# Patient Record
Sex: Female | Born: 1970 | Race: White | Hispanic: No | Marital: Married | Smoking: Never smoker
Health system: Southern US, Community
[De-identification: ages and names within clinical notes are randomized; demographics above are authoritative.]

## PROBLEM LIST (undated history)

## (undated) DIAGNOSIS — E559 Vitamin D deficiency, unspecified: Secondary | ICD-10-CM

## (undated) DIAGNOSIS — M329 Systemic lupus erythematosus, unspecified: Secondary | ICD-10-CM

## (undated) DIAGNOSIS — M1990 Unspecified osteoarthritis, unspecified site: Secondary | ICD-10-CM

## (undated) DIAGNOSIS — M797 Fibromyalgia: Secondary | ICD-10-CM

## (undated) DIAGNOSIS — IMO0002 Reserved for concepts with insufficient information to code with codable children: Secondary | ICD-10-CM

## (undated) DIAGNOSIS — M0820 Juvenile rheumatoid arthritis with systemic onset, unspecified site: Secondary | ICD-10-CM

## (undated) DIAGNOSIS — I10 Essential (primary) hypertension: Secondary | ICD-10-CM

## (undated) DIAGNOSIS — I471 Supraventricular tachycardia, unspecified: Secondary | ICD-10-CM

## (undated) DIAGNOSIS — F5101 Primary insomnia: Secondary | ICD-10-CM

## (undated) DIAGNOSIS — M546 Pain in thoracic spine: Secondary | ICD-10-CM

## (undated) DIAGNOSIS — I011 Acute rheumatic endocarditis: Secondary | ICD-10-CM

## (undated) DIAGNOSIS — E538 Deficiency of other specified B group vitamins: Secondary | ICD-10-CM

## (undated) DIAGNOSIS — F419 Anxiety disorder, unspecified: Secondary | ICD-10-CM

## (undated) DIAGNOSIS — M5134 Other intervertebral disc degeneration, thoracic region: Secondary | ICD-10-CM

## (undated) DIAGNOSIS — M069 Rheumatoid arthritis, unspecified: Secondary | ICD-10-CM

## (undated) HISTORY — PX: ACHILLES TENDON REPAIR: SUR1153

## (undated) HISTORY — PX: BACK SURGERY: SHX140

## (undated) HISTORY — PX: ABLATION: SHX5711

## (undated) HISTORY — PX: OTHER SURGICAL HISTORY: SHX169

## (undated) HISTORY — PX: TUBAL LIGATION: SHX77

## (undated) HISTORY — PX: OSTECTOMY CALCANEUS: SUR969

## (undated) HISTORY — PX: NECK SURGERY: SHX720

## (undated) HISTORY — PX: HYSTEROSCOPY W/ ENDOMETRIAL ABLATION: SUR665

## (undated) HISTORY — PX: COLONOSCOPY: SHX174

---

## 2008-01-25 ENCOUNTER — Emergency Department (HOSPITAL_COMMUNITY): Admission: EM | Admit: 2008-01-25 | Discharge: 2008-01-25 | Payer: Self-pay | Admitting: Emergency Medicine

## 2008-08-23 ENCOUNTER — Emergency Department (HOSPITAL_COMMUNITY): Admission: EM | Admit: 2008-08-23 | Discharge: 2008-08-23 | Payer: Self-pay | Admitting: Emergency Medicine

## 2013-09-02 ENCOUNTER — Observation Stay: Payer: Self-pay | Admitting: Obstetrics and Gynecology

## 2013-09-02 LAB — BASIC METABOLIC PANEL
Anion Gap: 3 — ABNORMAL LOW (ref 7–16)
Calcium, Total: 9.2 mg/dL (ref 8.5–10.1)
Creatinine: 0.68 mg/dL (ref 0.60–1.30)
Osmolality: 276 (ref 275–301)
Potassium: 3.7 mmol/L (ref 3.5–5.1)

## 2013-09-02 LAB — CBC
HCT: 28.4 % — ABNORMAL LOW (ref 35.0–47.0)
HGB: 9.7 g/dL — ABNORMAL LOW (ref 12.0–16.0)
MCH: 27.2 pg (ref 26.0–34.0)
RDW: 14.3 % (ref 11.5–14.5)

## 2013-09-02 LAB — URINALYSIS, COMPLETE
Glucose,UR: NEGATIVE mg/dL (ref 0–75)
Hyaline Cast: 2
Protein: NEGATIVE
Specific Gravity: 1.018 (ref 1.003–1.030)

## 2013-09-02 LAB — GC/CHLAMYDIA PROBE AMP

## 2013-09-02 LAB — PREGNANCY, URINE: Pregnancy Test, Urine: NEGATIVE m[IU]/mL

## 2013-09-03 LAB — CBC WITH DIFFERENTIAL/PLATELET
Basophil %: 0.5 %
Eosinophil #: 0.1 10*3/uL (ref 0.0–0.7)
HCT: 31.7 % — ABNORMAL LOW (ref 35.0–47.0)
Lymphocyte %: 21.5 %
MCH: 28.2 pg (ref 26.0–34.0)
MCHC: 34.9 g/dL (ref 32.0–36.0)
MCV: 81 fL (ref 80–100)
Monocyte #: 0.7 x10 3/mm (ref 0.2–0.9)
Monocyte %: 6.8 %
Neutrophil #: 6.8 10*3/uL — ABNORMAL HIGH (ref 1.4–6.5)
Neutrophil %: 69.8 %
Platelet: 334 10*3/uL (ref 150–440)
RBC: 3.91 10*6/uL (ref 3.80–5.20)
WBC: 9.7 10*3/uL (ref 3.6–11.0)

## 2014-07-13 ENCOUNTER — Emergency Department: Payer: Self-pay | Admitting: Emergency Medicine

## 2014-07-25 ENCOUNTER — Emergency Department (HOSPITAL_COMMUNITY)
Admission: EM | Admit: 2014-07-25 | Discharge: 2014-07-25 | Disposition: A | Discharge status: Home / Self Care | Payer: No Typology Code available for payment source | Attending: Emergency Medicine | Admitting: Emergency Medicine

## 2014-07-25 ENCOUNTER — Emergency Department (HOSPITAL_COMMUNITY): Payer: No Typology Code available for payment source

## 2014-07-25 ENCOUNTER — Encounter (HOSPITAL_COMMUNITY): Payer: Self-pay | Admitting: Emergency Medicine

## 2014-07-25 DIAGNOSIS — S52599A Other fractures of lower end of unspecified radius, initial encounter for closed fracture: Secondary | ICD-10-CM | POA: Diagnosis not present

## 2014-07-25 DIAGNOSIS — Y9351 Activity, roller skating (inline) and skateboarding: Secondary | ICD-10-CM | POA: Insufficient documentation

## 2014-07-25 DIAGNOSIS — S4980XA Other specified injuries of shoulder and upper arm, unspecified arm, initial encounter: Secondary | ICD-10-CM | POA: Insufficient documentation

## 2014-07-25 DIAGNOSIS — S46909A Unspecified injury of unspecified muscle, fascia and tendon at shoulder and upper arm level, unspecified arm, initial encounter: Secondary | ICD-10-CM | POA: Diagnosis present

## 2014-07-25 DIAGNOSIS — Z8739 Personal history of other diseases of the musculoskeletal system and connective tissue: Secondary | ICD-10-CM | POA: Insufficient documentation

## 2014-07-25 DIAGNOSIS — Y9289 Other specified places as the place of occurrence of the external cause: Secondary | ICD-10-CM | POA: Insufficient documentation

## 2014-07-25 DIAGNOSIS — S5291XA Unspecified fracture of right forearm, initial encounter for closed fracture: Secondary | ICD-10-CM

## 2014-07-25 HISTORY — DX: Systemic lupus erythematosus, unspecified: M32.9

## 2014-07-25 HISTORY — DX: Reserved for concepts with insufficient information to code with codable children: IMO0002

## 2014-07-25 HISTORY — DX: Acute rheumatic endocarditis: I01.1

## 2014-07-25 HISTORY — DX: Fibromyalgia: M79.7

## 2014-07-25 MED ORDER — IBUPROFEN 800 MG PO TABS: 800.0000 mg | ORAL_TABLET | Freq: Three times a day (TID) | ORAL | Status: DC

## 2014-07-25 MED ORDER — OXYCODONE-ACETAMINOPHEN 5-325 MG PO TABS: 1.0000 | ORAL_TABLET | ORAL | Status: DC | PRN

## 2014-07-25 MED ORDER — OXYCODONE-ACETAMINOPHEN 5-325 MG PO TABS
1.0000 | ORAL_TABLET | Freq: Once | ORAL | Status: AC
  Administered 2014-07-25: 1 via ORAL
  Filled 2014-07-25: qty 1

## 2014-07-25 NOTE — ED Notes (Signed)
Pt reports taking Ibuprofen roughly 20 minutes ago.

## 2014-07-25 NOTE — Discharge Instructions (Signed)
Radial Fracture °You have a broken bone (fracture) of the forearm. This is the part of your arm between the elbow and your wrist. Your forearm is made up of two bones. These are the radius and ulna. Your fracture is in the radial shaft. This is the bone in your forearm located on the thumb side. A cast or splint is used to protect and keep your injured bone from moving. The cast or splint will be on generally for about 5 to 6 weeks, with individual variations. °HOME CARE INSTRUCTIONS  °· Keep the injured part elevated while sitting or lying down. Keep the injury above the level of your heart (the center of the chest). This will decrease swelling and pain. °· Apply ice to the injury for 15-20 minutes, 03-04 times per day while awake, for 2 days. Put the ice in a plastic bag and place a towel between the bag of ice and your cast or splint. °· Move your fingers to avoid stiffness and minimize swelling. °· If you have a plaster or fiberglass cast: °¨ Do not try to scratch the skin under the cast using sharp or pointed objects. °¨ Check the skin around the cast every day. You may put lotion on any red or sore areas. °¨ Keep your cast dry and clean. °· If you have a plaster splint: °¨ Wear the splint as directed. °¨ You may loosen the elastic around the splint if your fingers become numb, tingle, or turn cold or blue. °¨ Do not put pressure on any part of your cast or splint. It may break. Rest your cast only on a pillow for the first 24 hours until it is fully hardened. °· Your cast or splint can be protected during bathing with a plastic bag. Do not lower the cast or splint into water. °· Only take over-the-counter or prescription medicines for pain, discomfort, or fever as directed by your caregiver. °SEEK IMMEDIATE MEDICAL CARE IF:  °· Your cast gets damaged or breaks. °· You have more severe pain or swelling than you did before getting the cast. °· You have severe pain when stretching your fingers. °· There is a bad  smell, new stains and/or pus-like (purulent) drainage coming from under the cast. °· Your fingers or hand turn pale or blue and become cold or your loose feeling. °Document Released: 04/12/2006 Document Revised: 01/22/2012 Document Reviewed: 07/09/2006 °ExitCare® Patient Information ©2015 [employer]. This information is not intended to replace advice given to you by your health care provider. Make sure you discuss any questions you have with your health care provider. ° °

## 2014-07-25 NOTE — ED Notes (Signed)
Pt states she fell while roller skating landing on right wrist. C/o pain from wrist into forearm. Some bruising noted. Able to move fingers and has positive sensation and pulses.

## 2014-07-27 NOTE — ED Provider Notes (Signed)
CSN: [id number]     Arrival date & time 07/25/14  1941 History   First MD Initiated Contact with Patient 07/25/14 2023     Chief Complaint  Patient presents with  . Arm Injury     (Consider location/radiation/quality/duration/timing/severity/associated sxs/prior Treatment) HPI   STEPHNIE PARLIER is a 43 y.o. female who presents to the Emergency Department complaining of pain and swelling to her distal right forearm and wrist.  She states that she feel while roller skating just prior to ED arrival.  Landed on an outstretched hand.  Reports pain with movement of the wrist.  She has applied ice w/o relief.  She denies other injuries, numbness or weakness of her hand or arm.  Nothing seems to make the pain better.  Patient is right hand dominant.     Past Medical History  Diagnosis Date  . Lupus   . Rheumatoid aortitis   . Fibromyalgia    Past Surgical History  Procedure Laterality Date  . Neck surgery    . Tubal ligation    . Ablation     No family history on file. History  Substance Use Topics  . Smoking status: Never Smoker   . Smokeless tobacco: Not on file  . Alcohol Use: No   OB History   Grav Para Term Preterm Abortions TAB SAB Ect Mult Living                 Review of Systems  Constitutional: Negative for fever and chills.  Genitourinary: Negative for dysuria and difficulty urinating.  Musculoskeletal: Positive for arthralgias and joint swelling. Negative for neck pain.  Skin: Negative for color change and wound.  Neurological: Negative for syncope, weakness and numbness.  All other systems reviewed and are negative.     Allergies  Review of patient's allergies indicates no known allergies.  Home Medications   Prior to Admission medications   Medication Sig Start Date End Date Taking? Authorizing Provider  ibuprofen (ADVIL,MOTRIN) 800 MG tablet Take 1 tablet (800 mg total) by mouth 3 (three) times daily. 07/25/14   Jantz Main L. Daja Shuping, PA-C   oxyCODONE-acetaminophen (PERCOCET/ROXICET) 5-325 MG per tablet Take 1 tablet by mouth every 4 (four) hours as needed. 07/25/14   Oluwatamilore Starnes L. Kristoffer Bala, PA-C   BP 170/101  Pulse 100  Temp(Src) 99.5 F (37.5 C) (Oral)  Resp 20  Ht 5\' 3"  (1.6 m)  Wt 248 lb (112.492 kg)  BMI 43.94 kg/m2  SpO2 98% Physical Exam  Nursing note and vitals reviewed. Constitutional: She is oriented to person, place, and time. She appears well-developed and well-nourished. No distress.  HENT:  Head: Normocephalic and atraumatic.  Cardiovascular: Normal rate, regular rhythm, normal heart sounds and intact distal pulses.   No murmur heard. Pulmonary/Chest: Effort normal and breath sounds normal. No respiratory distress. She exhibits no tenderness.  Musculoskeletal: She exhibits edema and tenderness.  ttp to the distal right wrist. Mild to moderate STS present. Radial pulse is brisk, distal sensation intact.  CR< 2 sec.  No bruising or bony deformity.  Compartments of the arm are soft. No tenderness proximal to the distal forearm.    Neurological: She is alert and oriented to person, place, and time. She exhibits normal muscle tone. Coordination normal.  Skin: Skin is warm and dry.    ED Course  Procedures (including critical care time) Labs Review Labs Reviewed - No data to display  Imaging Review Dg Forearm Right  07/25/2014   CLINICAL DATA:  History of  trauma from a fall complaining of pain in the right forearm.  EXAM: RIGHT FOREARM - 2 VIEW  COMPARISON:  No priors.  FINDINGS: Mildly comminuted intra-articular fracture of the distal radius appears mildly impacted. Overlying soft tissue swelling is noted. The ulna is intact.  IMPRESSION: 1. Mildly comminuted mildly impacted intra-articular fracture of the distal radius.   Electronically Signed   By: Trudie Reed M.D.   On: 07/25/2014 20:16   Dg Wrist Complete Right  07/25/2014   CLINICAL DATA:  Ulnar sided wrist pain.  EXAM: RIGHT WRIST - COMPLETE 3+ VIEW   COMPARISON:  07/13/2014 radiographs.  FINDINGS: There is a subtle transverse impacted fracture of the distal radial metaphysis. Intra-articular extension is most evident on the scaphoid view of the wrist. Overlying soft tissue swelling is present. There is no angulation or displacement of the fracture. The distal ulna is intact. Carpal bones appear within normal limits.  IMPRESSION: Comminuted but nondisplaced transverse distal radius fracture with intra-articular extension into the radiocarpal and distal radioulnar joints.   Electronically Signed   By: Andreas Newport M.D.   On: 07/25/2014 20:18    EKG Interpretation None      MDM   Final diagnoses:  Radius fracture, right, closed, initial encounter    sugar tong splint applied, pain improved, remains NV intact.  Pt agrees to elevate, ice and close orthopedic f/u.  Referral given for Dr. Romeo Apple.  Rx for ibuprofen and percocet for pain.  Pt is feeling better and appears stable for d/c    Frantz Quattrone L. Trisha Mangle, PA-C 07/27/14 2028

## 2014-07-28 ENCOUNTER — Ambulatory Visit (INDEPENDENT_AMBULATORY_CARE_PROVIDER_SITE_OTHER): Payer: No Typology Code available for payment source | Admitting: Orthopedic Surgery

## 2014-07-28 ENCOUNTER — Encounter: Payer: Self-pay | Admitting: Orthopedic Surgery

## 2014-07-28 VITALS — Ht 63.0 in | Wt 248.0 lb

## 2014-07-28 DIAGNOSIS — S52509A Unspecified fracture of the lower end of unspecified radius, initial encounter for closed fracture: Secondary | ICD-10-CM | POA: Insufficient documentation

## 2014-07-28 DIAGNOSIS — S52599A Other fractures of lower end of unspecified radius, initial encounter for closed fracture: Secondary | ICD-10-CM

## 2014-07-28 DIAGNOSIS — S52501A Unspecified fracture of the lower end of right radius, initial encounter for closed fracture: Secondary | ICD-10-CM

## 2014-07-28 MED ORDER — OXYCODONE-ACETAMINOPHEN 10-325 MG PO TABS: 1.0000 | ORAL_TABLET | ORAL | Status: DC | PRN

## 2014-07-28 NOTE — Patient Instructions (Signed)
Return to work, no lifting patients

## 2014-07-28 NOTE — ED Provider Notes (Signed)
Medical screening examination/treatment/procedure(s) were performed by non-physician practitioner and as supervising physician I was immediately available for consultation/collaboration.   EKG Interpretation None        Winfrey Chillemi L Kevionna Heffler, MD 07/28/14 1354 

## 2014-07-28 NOTE — Progress Notes (Signed)
Chief Complaint  Patient presents with  . Follow-up    er follow up right wrist fracture, DOI 07/25/14    HPI: Chief Complaint  Patient presents with  . Follow-up    er follow up right wrist fracture, DOI 07/25/14   43 year old female broke her right wrist on 07/25/2014 secondary to a fall while skating complains of pain swelling which is constant over the right distal radius without deformity. Pain is 8/10 unrelieved by 5 mg Percocet along with ibuprofen. 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  PHYSICAL EXAM  VITAL SIGNS: Ht 5\' 3"  (1.6 m)  Wt 248 lb (112.492 kg)  BMI 43.94 kg/m2 ]  GENERAL normal development grooming and hygiene with obesity  MENTAL STATUS awake alert and oriented x3  MOOD/AFFECT ARE NORMAL   GAIT no pain with ambulation   EXAM OF THE right wrist SKIN slight ecchymosis to the skin INSPECTION mild tenderness at the distal radius ROM painful range of motion and incomplete range of motion secondary to that pain STABILITY normal MOTOR GRADE 5/5     VASC normal radial pulse  NEURO normal sensation  LYMPH no swollen lymph nodes   IMAGING STUDIES transverse fracture distal radius nondisplaced   Dx distal radius fracture   PLAN  short arm cast

## 2014-08-03 ENCOUNTER — Ambulatory Visit (INDEPENDENT_AMBULATORY_CARE_PROVIDER_SITE_OTHER): Payer: Self-pay | Admitting: Orthopedic Surgery

## 2014-08-03 DIAGNOSIS — S52501A Unspecified fracture of the lower end of right radius, initial encounter for closed fracture: Secondary | ICD-10-CM

## 2014-08-03 DIAGNOSIS — S52599A Other fractures of lower end of unspecified radius, initial encounter for closed fracture: Secondary | ICD-10-CM

## 2014-08-04 NOTE — Progress Notes (Signed)
Chief Complaint   Patient presents with   .  Follow-up       er follow up right wrist fracture, DOI 07/25/14    Cast loose;  Hard brace applied   Keep appt.

## 2014-08-11 ENCOUNTER — Telehealth: Payer: Self-pay | Admitting: Orthopedic Surgery

## 2014-08-11 ENCOUNTER — Other Ambulatory Visit: Payer: Self-pay | Admitting: *Deleted

## 2014-08-11 MED ORDER — OXYCODONE-ACETAMINOPHEN 10-325 MG PO TABS: 1.0000 | ORAL_TABLET | ORAL | Status: DC | PRN

## 2014-08-11 NOTE — Telephone Encounter (Signed)
Ms. Angulo is calling asking for a refill on oxyCODONE-acetaminophen (PERCOCET) 10-325 MG per tablet called to [facility] on [address]. Please advise?

## 2014-08-12 NOTE — Telephone Encounter (Signed)
Prescription available, patient aware  

## 2014-08-13 NOTE — Telephone Encounter (Signed)
Patient picked up Rx

## 2014-08-25 ENCOUNTER — Other Ambulatory Visit: Payer: Self-pay | Admitting: *Deleted

## 2014-08-25 ENCOUNTER — Telehealth: Payer: Self-pay | Admitting: Orthopedic Surgery

## 2014-08-25 MED ORDER — OXYCODONE-ACETAMINOPHEN 10-325 MG PO TABS: 1.0000 | ORAL_TABLET | ORAL | Status: DC | PRN

## 2014-08-25 NOTE — Telephone Encounter (Signed)
Patient is requesting a refill on her pain medicationoxyCODONE-acetaminophen (PERCOCET) 10-325 MG per tablet please advise?

## 2014-08-25 NOTE — Telephone Encounter (Signed)
Prescription available, patient aware  

## 2014-08-26 NOTE — Telephone Encounter (Signed)
Patient picked up Rx

## 2014-09-08 ENCOUNTER — Ambulatory Visit (INDEPENDENT_AMBULATORY_CARE_PROVIDER_SITE_OTHER): Payer: No Typology Code available for payment source

## 2014-09-08 ENCOUNTER — Ambulatory Visit (INDEPENDENT_AMBULATORY_CARE_PROVIDER_SITE_OTHER): Payer: Self-pay | Admitting: Orthopedic Surgery

## 2014-09-08 ENCOUNTER — Encounter: Payer: Self-pay | Admitting: Orthopedic Surgery

## 2014-09-08 VITALS — BP 170/101 | Ht 63.0 in | Wt 248.0 lb

## 2014-09-08 DIAGNOSIS — S62101D Fracture of unspecified carpal bone, right wrist, subsequent encounter for fracture with routine healing: Secondary | ICD-10-CM

## 2014-09-08 DIAGNOSIS — S62101A Fracture of unspecified carpal bone, right wrist, initial encounter for closed fracture: Secondary | ICD-10-CM | POA: Insufficient documentation

## 2014-09-08 MED ORDER — OXYCODONE-ACETAMINOPHEN 10-325 MG PO TABS
1.0000 | ORAL_TABLET | ORAL | Status: DC | PRN
Start: 2014-09-08 — End: 2014-09-23

## 2014-09-08 NOTE — Progress Notes (Signed)
Patient ID: DANINE HOR, female   DOB: [birth year], 43 y.o.   MRN: [id number] Chief Complaint  Patient presents with  . Follow-up    6 week recheck + xray Right wrist fx, DOI 07/25/14    BP 170/101  Ht 5\' 3"  (1.6 m)  Wt 248 lb (112.492 kg)  BMI 43.94 kg/m2  Fracture care follow-up right distal radius fracture nondisplaced treated  with DonJoy warm-and-form forearm brace  X-rays look good. Tenderness at the fracture site is noted surrounding the keep her in the brace 3 more weeks and continue Percocet for pain relief   Meds ordered this encounter  Medications  . oxyCODONE-acetaminophen (PERCOCET) 10-325 MG per tablet    Sig: Take 1 tablet by mouth every 4 (four) hours as needed for pain.    Dispense:  84 tablet    Refill:  0

## 2014-09-08 NOTE — Patient Instructions (Signed)
BRACE FOR 3 MORE WEEKS

## 2014-09-23 ENCOUNTER — Telehealth: Payer: Self-pay | Admitting: Orthopedic Surgery

## 2014-09-23 ENCOUNTER — Other Ambulatory Visit: Payer: Self-pay | Admitting: *Deleted

## 2014-09-23 MED ORDER — OXYCODONE-ACETAMINOPHEN 10-325 MG PO TABS: 1.0000 | ORAL_TABLET | ORAL | Status: DC | PRN

## 2014-09-23 NOTE — Telephone Encounter (Signed)
Patient is calling to have pain medicine refilled oxyCODONE-acetaminophen (PERCOCET) 10-325 MG per tablet please advise?

## 2014-09-24 NOTE — Telephone Encounter (Signed)
Prescription available for pick up 

## 2014-10-01 ENCOUNTER — Ambulatory Visit: Payer: No Typology Code available for payment source | Admitting: Orthopedic Surgery

## 2014-10-05 ENCOUNTER — Ambulatory Visit (INDEPENDENT_AMBULATORY_CARE_PROVIDER_SITE_OTHER): Payer: Self-pay | Admitting: Orthopedic Surgery

## 2014-10-05 ENCOUNTER — Ambulatory Visit (INDEPENDENT_AMBULATORY_CARE_PROVIDER_SITE_OTHER): Payer: No Typology Code available for payment source

## 2014-10-05 ENCOUNTER — Encounter: Payer: Self-pay | Admitting: Orthopedic Surgery

## 2014-10-05 VITALS — BP 152/104 | Ht 63.0 in | Wt 248.0 lb

## 2014-10-05 DIAGNOSIS — S62101D Fracture of unspecified carpal bone, right wrist, subsequent encounter for fracture with routine healing: Secondary | ICD-10-CM

## 2014-10-05 NOTE — Addendum Note (Signed)
Addended by: Fuller Canada E on: 10/05/2014 11:05 AM   Modules accepted: Medications

## 2014-10-05 NOTE — Patient Instructions (Signed)
Call to arrange therapy at [facility] outpatient

## 2014-10-05 NOTE — Progress Notes (Signed)
Patient ID: Veronica Nguyen, female   DOB: April 24, 1971, 43 y.o.   MRN: [id number] Chief Complaint  Patient presents with  . Follow-up    3 week recheck on right wrist fracture. DOI 07-25-14.    8 weeks since injury distal radius fracture right wrist still complains of soreness. X-ray shows fracture healing. Exam shows tenderness in the soft tissues and painful wrist flexion  Recommend occupational therapy  Recommend wrist splint  Recommend continue medicine as needed with oxycodone.  Return in 4 weeks after OT

## 2014-10-07 ENCOUNTER — Other Ambulatory Visit: Payer: Self-pay | Admitting: *Deleted

## 2014-10-07 ENCOUNTER — Telehealth: Payer: Self-pay | Admitting: Orthopedic Surgery

## 2014-10-07 MED ORDER — OXYCODONE-ACETAMINOPHEN 10-325 MG PO TABS: 1.0000 | ORAL_TABLET | ORAL | Status: DC | PRN

## 2014-10-07 NOTE — Telephone Encounter (Signed)
Patient is requesting refill on pain medication: oxyCODONE-acetaminophen (PERCOCET) 10-325 MG per tablet [[id number]]   Her next scheduled appointment, 11/02/14.  Patient's ph# is [phone]

## 2014-10-12 NOTE — Telephone Encounter (Signed)
Prescription available, patient aware  

## 2014-10-27 ENCOUNTER — Telehealth: Payer: Self-pay | Admitting: Orthopedic Surgery

## 2014-10-27 ENCOUNTER — Other Ambulatory Visit: Payer: Self-pay | Admitting: *Deleted

## 2014-10-27 ENCOUNTER — Other Ambulatory Visit: Payer: Self-pay | Admitting: Orthopedic Surgery

## 2014-10-27 DIAGNOSIS — S62101D Fracture of unspecified carpal bone, right wrist, subsequent encounter for fracture with routine healing: Secondary | ICD-10-CM

## 2014-10-27 MED ORDER — OXYCODONE-ACETAMINOPHEN 5-325 MG PO TABS: 1.0000 | ORAL_TABLET | ORAL | Status: DC | PRN

## 2014-10-27 MED ORDER — OXYCODONE-ACETAMINOPHEN 10-325 MG PO TABS: 1.0000 | ORAL_TABLET | ORAL | Status: DC | PRN

## 2014-10-27 NOTE — Telephone Encounter (Signed)
Patient needs a refill ooxyCODONE-acetaminophen (PERCOCET) 10-325 MG per tablet, please advise?

## 2014-10-27 NOTE — Telephone Encounter (Signed)
Prescription available, patient aware  

## 2014-10-27 NOTE — Telephone Encounter (Signed)
Patient Picked up Rx 

## 2014-11-02 ENCOUNTER — Encounter: Payer: Self-pay | Admitting: Orthopedic Surgery

## 2014-11-02 ENCOUNTER — Ambulatory Visit: Payer: No Typology Code available for payment source | Admitting: Orthopedic Surgery

## 2014-11-11 ENCOUNTER — Other Ambulatory Visit: Payer: Self-pay | Admitting: *Deleted

## 2014-11-11 ENCOUNTER — Telehealth: Payer: Self-pay | Admitting: Orthopedic Surgery

## 2014-11-11 MED ORDER — OXYCODONE-ACETAMINOPHEN 5-325 MG PO TABS: 1.0000 | ORAL_TABLET | ORAL | Status: DC | PRN

## 2014-11-11 NOTE — Telephone Encounter (Signed)
Patient is asking for a pain medication refill, she did miss her last follow up appointment with Dr. Romeo Apple but has rescheduled to 11/16/14, she is asking for a refill on oxyCODONE-acetaminophen (PERCOCET/ROXICET) 5-325 MG per tablet  Please advise?

## 2014-11-12 NOTE — Telephone Encounter (Signed)
Patient picked up prescription.

## 2014-11-16 ENCOUNTER — Ambulatory Visit: Payer: No Typology Code available for payment source | Admitting: Orthopedic Surgery

## 2014-11-18 ENCOUNTER — Encounter: Payer: Self-pay | Admitting: Orthopedic Surgery

## 2015-03-05 NOTE — Op Note (Signed)
PATIENT NAME:  Veronica Nguyen, Veronica Nguyen MR#:  [id number] DATE OF BIRTH:  1971/04/01  DATE OF PROCEDURE:  09/03/2013  PREOPERATIVE DIAGNOSIS: Menorrhagia.   POSTOPERATIVE DIAGNOSIS: Menorrhagia.   PROCEDURES: 1.  Dilation and curettage.  2.  Hysteroscopy.  3.  NovaSure endometrial ablation.   ANESTHESIA:  General endotracheal anesthesia.  SURGEON:  Jennell Corner, M.D.  INDICATIONS: A 44 year old gravida 3, para 2 patient with 6 week history of heavy menstrual bleeding. The patient was admitted via the Emergency Department on 09/02/2013, with syncopal episodes, received 2 units of blood and continued to pass large clots.   PROCEDURE: After adequate general endotracheal anesthesia, the patient was placed in the dorsal supine position, legs in the candy cane stirrups. Perineum and vagina were prepped with Betadine. The patient was sterilely draped. A red Robinson catheter was placed into the bladder, yielded 100 mL of clear urine. Weighted speculum was placed in the posterior vaginal vault, and the anterior cervix was grasped with a single-tooth tenaculum. The cervix was then dilated to #15 Hanks dilator without difficulty. Endometrial curettage was then performed. Tissue will be sent to pathology for final identification. Uterus sounds to 9 cm. Hysteroscope was advanced into the endometrial cavity without difficulty. Lactated Ringer's was used as distending medium. Fluffy endometrium noted. No discrete masses identified. Hysteroscope was removed. The NovaSure ablator was brought up to the operative field, and the ablator was then placed in the endometrial cavity without difficulty. Array was opened. Cervical width measured at 3.4 cm based on the sounding length of 9 and cervical length of 4.5. The cavity length was estimated at 4.5 cm. Cavity assessment test was performed and passed, and based on the cavity length and width, power setting was at 84, and the ablation took place for 1 minute and 43  seconds. The ablator was then removed and repeat hysteroscopic evaluation revealed normal charring effect inside the endometrial cavity. There were no complications. The patient tolerated the procedure well. She did receive 1 gram IV Ancef prior to commencement of the case. The patient was taken to the recovery room in good condition.      ____________________________ Suzy Bouchard, MD tjs:dmm D: 09/03/2013 10:47:28 ET T: 09/03/2013 11:06:48 ET JOB#: [id number]  cc: Suzy Bouchard, MD, <Dictator> Suzy Bouchard MD ELECTRONICALLY SIGNED 09/03/2013 15:41

## 2015-03-05 NOTE — H&P (Signed)
PATIENT NAME:  Veronica Nguyen, Veronica Nguyen MR#:  [id number] DATE OF BIRTH:  11/15/1970  DATE OF ADMISSION:  09/02/2013  PREOPERATIVE DIAGNOSES:  1.  Menorrhagia, unresponsive to conservative measures. 2.  Symptomatic anemia.  HISTORY OF PRESENT ILLNESS: In general, she is a 44 year old morbidly obese white female who I had seen through the undersigned service approximately 2-1/2 weeks ago with heavy bleeding. I attempted to manage with progesterone, which did not help. I then tried to have her do 3 times a day birth control pills, starting on the 14th, which she just finished yesterday. She states the bleeding is no better.   Her hemoglobin when I saw her on the 13th was 12.2. Crit today is 28 consistent with some blood loss.  Otherwise, she feels well.   PAST MEDICAL HISTORY: Reviewed.   MEDICATIONS: She is on Plaquenil, Cymbalta, lisinopril and other meds on chart. She has had surgery for a tubal ligation. She also had neck surgery for nerve damage.   REVIEW OF SYSTEMS:  Normal routine, except as above.  PHYSICAL EXAMINATION: VITAL SIGNS: Weight is 250 and height is 5 feet 3 for BMI of 44.  LUNGS: Clear.  HEART: Regular. ABDOMEN: Morbidly obese.  PELVIS: Deferred.  LABORATORY AND DIAGNOSTICS: In reference to ultrasound done last 2 weeks ago showed normal uterus. Wet mount negative.   DISCUSSION: I discussed with the patient that given the fact that we tried conservative measures and were unable to control her bleeding problems it is reasonable to proceed with dilation and curettage. I also recommend we do an endometrial ablation as well. If the pathology from the dilation and curettage comes back benign, there is a good chance we will not need to do anything else with the understanding that if there is anything suspicious for cancer on dilation and curettage she would require more surgery. She states her understanding and desire to proceed. I discussed with her the procedure and the risks to  include but not limited to bleeding; inuury to bowel/bladder/other organs; risk of reoperation She expressed her understanding and desired to proceed.  IMPRESSION: Menorrhagia with symptomatic anemia.   PLAN: Dilation and curettage, hysteroscopy. We are going to do it in the morning. Will obviously admit her tonight.  ____________________________ Reatha Harps. Logan Bores, MD rle:sb D: 09/02/2013 16:08:12 ET T: 09/02/2013 16:43:22 ET JOB#: [id number]  cc: Ricky L. Logan Bores, MD, <Dictator> Augustina Mood MD ELECTRONICALLY SIGNED 09/02/2013 17:06

## 2015-11-24 ENCOUNTER — Encounter (HOSPITAL_COMMUNITY): Payer: Self-pay | Admitting: *Deleted

## 2015-11-24 ENCOUNTER — Emergency Department (HOSPITAL_COMMUNITY)
Admission: EM | Admit: 2015-11-24 | Discharge: 2015-11-24 | Disposition: A | Discharge status: Home / Self Care | Payer: Managed Care, Other (non HMO) | Attending: Emergency Medicine | Admitting: Emergency Medicine

## 2015-11-24 DIAGNOSIS — M797 Fibromyalgia: Secondary | ICD-10-CM | POA: Diagnosis not present

## 2015-11-24 DIAGNOSIS — L049 Acute lymphadenitis, unspecified: Secondary | ICD-10-CM | POA: Insufficient documentation

## 2015-11-24 DIAGNOSIS — Z79899 Other long term (current) drug therapy: Secondary | ICD-10-CM | POA: Diagnosis not present

## 2015-11-24 DIAGNOSIS — M329 Systemic lupus erythematosus, unspecified: Secondary | ICD-10-CM | POA: Insufficient documentation

## 2015-11-24 DIAGNOSIS — M069 Rheumatoid arthritis, unspecified: Secondary | ICD-10-CM | POA: Insufficient documentation

## 2015-11-24 DIAGNOSIS — H9201 Otalgia, right ear: Secondary | ICD-10-CM | POA: Insufficient documentation

## 2015-11-24 DIAGNOSIS — M542 Cervicalgia: Secondary | ICD-10-CM | POA: Diagnosis present

## 2015-11-24 MED ORDER — TRAMADOL HCL 50 MG PO TABS: 50.0000 mg | ORAL_TABLET | Freq: Four times a day (QID) | ORAL | Status: DC | PRN

## 2015-11-24 MED ORDER — HYDROCODONE-ACETAMINOPHEN 5-325 MG PO TABS: 1.0000 | ORAL_TABLET | Freq: Once | ORAL | Status: DC

## 2015-11-24 MED ORDER — TRAMADOL HCL 50 MG PO TABS
50.0000 mg | ORAL_TABLET | Freq: Once | ORAL | Status: AC
  Administered 2015-11-24: 50 mg via ORAL
  Filled 2015-11-24: qty 1

## 2015-11-24 MED ORDER — CEPHALEXIN 500 MG PO CAPS: 500.0000 mg | ORAL_CAPSULE | Freq: Four times a day (QID) | ORAL | Status: DC

## 2015-11-24 MED ORDER — CEPHALEXIN 500 MG PO CAPS
500.0000 mg | ORAL_CAPSULE | Freq: Once | ORAL | Status: AC
  Administered 2015-11-24: 500 mg via ORAL
  Filled 2015-11-24: qty 1

## 2015-11-24 NOTE — ED Notes (Signed)
Pt with right ear pain since Saturday, taking tylenol and ibuprofen, denies any other symptoms

## 2015-11-24 NOTE — Discharge Instructions (Signed)

## 2015-11-26 NOTE — ED Provider Notes (Signed)
CSN: [id number]     Arrival date & time 11/24/15  1944 History   First MD Initiated Contact with Patient 11/24/15 2006     Chief Complaint  Patient presents with  . Otalgia     (Consider location/radiation/quality/duration/timing/severity/associated sxs/prior Treatment) Patient is a 45 y.o. female presenting with ear pain. The history is provided by the patient.  Otalgia Location:  Right Severity:  Moderate Onset quality:  Gradual Duration:  4 days Timing:  Intermittent Progression:  Worsening Chronicity:  New Context comment:  Denies triggers Relieved by:  Nothing Worsened by:  Swallowing Ineffective treatments:  None tried Associated symptoms: neck pain   Associated symptoms: no congestion, no cough, no ear discharge, no fever, no headaches, no hearing loss, no rash, no rhinorrhea, no sore throat and no tinnitus   Risk factors: no chronic ear infection   Risk factors comment:  Had recent uri with sore throat, nasal congestion.   Past Medical History  Diagnosis Date  . Lupus (HCC)   . Rheumatoid aortitis (HCC)   . Fibromyalgia    Past Surgical History  Procedure Laterality Date  . Neck surgery    . Tubal ligation    . Ablation    . Achilles tendon repair     History reviewed. No pertinent family history. Social History  Substance Use Topics  . Smoking status: Never Smoker   . Smokeless tobacco: None  . Alcohol Use: No   OB History    No data available     Review of Systems  Constitutional: Negative for fever and chills.  HENT: Positive for ear pain. Negative for congestion, ear discharge, hearing loss, rhinorrhea, sinus pressure, sore throat, tinnitus, trouble swallowing and voice change.   Eyes: Negative for discharge.  Respiratory: Negative for cough, shortness of breath, wheezing and stridor.   Cardiovascular: Negative for chest pain.  Gastrointestinal: Negative for nausea.  Genitourinary: Negative.   Musculoskeletal: Positive for neck pain. Negative  for neck stiffness.  Skin: Negative for rash.  Neurological: Negative for headaches.      Allergies  Review of patient's allergies indicates no known allergies.  Home Medications   Prior to Admission medications   Medication Sig Start Date End Date Taking? Authorizing Provider  gabapentin (NEURONTIN) 300 MG capsule Take 300 mg by mouth 3 (three) times daily. 06/08/14  Yes Historical Provider, MD  hydroxychloroquine (PLAQUENIL) 200 MG tablet Take 200 mg by mouth 2 (two) times daily. 09/02/14  Yes Historical Provider, MD  lisinopril (PRINIVIL,ZESTRIL) 20 MG tablet Take 20 mg by mouth daily. 06/08/14  Yes Historical Provider, MD  traZODone (DESYREL) 100 MG tablet Take 100 mg by mouth at bedtime. 06/08/14  Yes Historical Provider, MD  cephALEXin (KEFLEX) 500 MG capsule Take 1 capsule (500 mg total) by mouth 4 (four) times daily. 11/24/15   Burgess Amor, PA-C  ibuprofen (ADVIL,MOTRIN) 800 MG tablet Take 1 tablet (800 mg total) by mouth 3 (three) times daily. Patient not taking: Reported on 11/24/2015 07/25/14   Tammy Triplett, PA-C  methocarbamol (ROBAXIN) 750 MG tablet Take 750 mg by mouth 4 (four) times daily.    Historical Provider, MD  oxyCODONE-acetaminophen (PERCOCET/ROXICET) 5-325 MG per tablet Take 1 tablet by mouth every 4 (four) hours as needed for severe pain. Patient not taking: Reported on 11/24/2015 11/11/14   Vickki Hearing, MD  traMADol (ULTRAM) 50 MG tablet Take 1 tablet (50 mg total) by mouth every 6 (six) hours as needed. 11/24/15   Burgess Amor, PA-C   BP  160/80 mmHg  Pulse 92  Temp(Src) 98.1 F (36.7 C) (Oral)  Resp 20  Ht 5\' 3"  (1.6 m)  Wt 113.399 kg  BMI 44.30 kg/m2  SpO2 100% Physical Exam  Constitutional: She is oriented to person, place, and time. She appears well-developed and well-nourished.  HENT:  Head: Normocephalic and atraumatic.  Right Ear: Tympanic membrane and ear canal normal. No drainage or swelling. No mastoid tenderness. Tympanic membrane is not  erythematous and not bulging.  Left Ear: Tympanic membrane and ear canal normal. No drainage or swelling. No mastoid tenderness. Tympanic membrane is not erythematous and not bulging.  Nose: No mucosal edema or rhinorrhea.  Mouth/Throat: Uvula is midline, oropharynx is clear and moist and mucous membranes are normal. No oropharyngeal exudate, posterior oropharyngeal edema, posterior oropharyngeal erythema or tonsillar abscesses.  Eyes: Conjunctivae are normal.  Cardiovascular: Normal rate and normal heart sounds.   Pulmonary/Chest: Effort normal. No respiratory distress. She has no wheezes. She has no rales.  Abdominal: Soft. There is no tenderness.  Musculoskeletal: Normal range of motion.  Lymphadenopathy:       Head (right side): Tonsillar adenopathy present.  ttp right tonsillar node, moderate edema, no erythema.  Neurological: She is alert and oriented to person, place, and time.  Skin: Skin is warm and dry. No rash noted.  Psychiatric: She has a normal mood and affect.    ED Course  Procedures (including critical care time) Labs Review Labs Reviewed - No data to display  Imaging Review No results found. I have personally reviewed and evaluated these images and lab results as part of my medical decision-making.   EKG Interpretation None      MDM   Final diagnoses:  Lymphadenitis, acute    Suspect localized lymphadenitis, probable reaction to recent uri.  She was placed on keflex, tramadol, advised warm compresses, continued ibuprofen, avoid rubbing the site.  Recheck by pcp  If not improved with tx (has appt in 2 days).   , PA-C 11/26/15 1341  11/28/15, DO 11/27/15 [id number]

## 2015-11-29 ENCOUNTER — Encounter (HOSPITAL_COMMUNITY): Payer: Self-pay | Admitting: Emergency Medicine

## 2015-11-29 ENCOUNTER — Emergency Department (HOSPITAL_COMMUNITY)
Admission: EM | Admit: 2015-11-29 | Discharge: 2015-11-29 | Disposition: A | Discharge status: Home / Self Care | Payer: Managed Care, Other (non HMO) | Source: Home / Self Care | Attending: Family Medicine | Admitting: Family Medicine

## 2015-11-29 DIAGNOSIS — K1120 Sialoadenitis, unspecified: Secondary | ICD-10-CM | POA: Diagnosis not present

## 2015-11-29 DIAGNOSIS — H6981 Other specified disorders of Eustachian tube, right ear: Secondary | ICD-10-CM | POA: Diagnosis not present

## 2015-11-29 DIAGNOSIS — T700XXD Otitic barotrauma, subsequent encounter: Secondary | ICD-10-CM

## 2015-11-29 MED ORDER — AMOXICILLIN-POT CLAVULANATE 875-125 MG PO TABS: 1.0000 | ORAL_TABLET | Freq: Two times a day (BID) | ORAL | Status: DC

## 2015-11-29 MED ORDER — HYDROCODONE-ACETAMINOPHEN 5-325 MG PO TABS: 1.0000 | ORAL_TABLET | ORAL | Status: DC | PRN

## 2015-11-29 NOTE — Discharge Instructions (Signed)
Salivary Gland Infection Stop the Keflex and start Augmentin Lemon drops frequently Stop the Ultram and use Norco for pain Warm moist heat to side of jaw. Sudafed PE 10 mg for decongestant and  Claritin or Zyrtec for drainage. A salivary gland infection is an infection in one or more of the glands that produce spit (saliva). You have six major salivary glands. Each gland has a duct that carries saliva into your mouth. Saliva keeps your mouth moist and breaks down the food that you eat. It also helps to prevent tooth decay. Two salivary glands are located just in front of your ears (parotid). The ducts for these glands open up inside your cheeks, near your back teeth. You also have two glands under your tongue (sublingual) and two glands under your jaw (submandibular). The ducts for these glands open under your tongue. Any salivary gland can become infected. Most infections occur in the parotid glands or submandibular glands. CAUSES Salivary glands can be infected by viruses or bacteria.  The mumps virus is the most common cause of viral salivary gland infections, though mumps is now rare in many areas because of vaccination.  This infection causes swelling in both parotid glands.  Viral infections are more common in children.  The bacteria that cause salivary gland infections are usually the same bacteria that normally live in your mouth.  A stone can form in a salivary gland and block the flow of saliva. As a result, saliva backs up into the salivary gland. Bacteria may then start to grow behind the blockage and cause infection.  Bacterial infections usually cause pain and swelling on one side of the face. Submandibular gland swelling occurs under the jaw. Parotid swelling occurs in front of the ear.  Bacterial infections are more common in adults. RISK FACTORS Children who do not get the MMR (measles, mumps, rubella) vaccine are more likely to get mumps, which can cause a viral salivary  gland infection. Risk factors for bacterial infections include:  Poor dental care (oral hygiene).  Smoking.  Not drinking enough water.  Having a disease that causes dry mouth and dry eyes (Mikulicz syndrome or Sjogren syndrome). SIGNS AND SYMPTOMS The main sign of salivary gland infection is a swollen salivary gland. This type of inflammation is often called sialadenitis. You may have swelling in front of your ear, under your jaw, or under your tongue. Swelling may get worse when you eat and decrease after you eat. Other signs and symptoms include:  Pain.  Tenderness.  Redness.  Dry mouth.  Bad taste in your mouth.  Difficulty chewing and swallowing.  Fever. DIAGNOSIS Your health care provider may suspect a salivary gland infection based on your signs and symptoms. He or she will also do a physical exam. The health care provider will look and feel inside your mouth to see whether a stone is blocking a salivary gland duct. You may need to see an ear, nose, and throat specialist (ENT or otolaryngologist) for diagnosis and treatment. You may also need to have diagnostic tests, such as:  An X-ray to check for a stone.  Other imaging studies to look for an abscess and to rule out other causes of swelling. These tests may include:  Ultrasound.  CT scan.  MRI.  Culture and sensitivity test. This involves collecting a sample of pus for testing in the lab to see what bacteria grow and what antibiotics they are sensitive to. The testing sample may be:  Swabbed from a salivary gland  duct.  Withdrawn from a swollen gland with a needle (aspiration). TREATMENT Viral salivary gland infections usually clear up without treatment. Bacterial infections are usually treated with antibiotic medicine. Severe infections that cause difficulty with swallowing may be treated with an IV antibiotic in the hospital. Other treatments may include:  Probing and widening the salivary duct to allow a  stone to pass. In some cases, a thin, flexible scope (endoscope) may be inserted into the duct to find a stone and remove it.  Breaking up a stone using sound waves.  Draining an infected gland (abscess) with a needle.  In some cases, you may need surgery so your health care provider can:  Remove a stone.  Drain pus from an abscess.  Remove a badly infected gland. HOME CARE INSTRUCTIONS  Take medicines only as directed by your health care provider.  If you were prescribed an antibiotic medicine, finish it all even if you start to feel better.  Follow these instructions every few hours:  Suck on a lemon candy to stimulate the flow of saliva.  Put a warm compress over the gland.  Gently massage the gland.  Drink enough fluid to keep your urine clear or pale yellow.  Rinse your mouth with a mixture of warm water and salt every few hours. To make this mixture, add a pinch of salt to 1 cup of warm water.  Practice good oral hygiene by brushing and flossing your teeth after meals and before you go to bed.  Do not use any tobacco products, including cigarettes, chewing tobacco, or electronic cigarettes. If you need help quitting, ask your health care provider. SEEK MEDICAL CARE IF:  You have pain and swelling in your face, jaw, or mouth after eating.  You have persistent swelling in any of these places:  In front of your ear.  Under your jaw.  Inside your mouth. SEEK IMMEDIATE MEDICAL CARE IF:   You have pain and swelling in your face, jaw, or mouth that are getting worse.  Your pain and swelling make it hard to swallow or breathe.   This information is not intended to replace advice given to you by your health care provider. Make sure you discuss any questions you have with your health care provider.   Document Released: 12/07/2004 Document Revised: 11/20/2014 Document Reviewed: 04/01/2014 Elsevier Interactive Patient Education [employer].

## 2015-11-29 NOTE — ED Notes (Signed)
Right ear pain, pain for 6 days. Pain started 1/7. Seen at [facility] on Thursday 1/12.  Provided with antibiotic and ultram.  Told eustachian tube blocked, lymph node swelling.  Patient reports continued symptoms, right ear pain, right jaw pain and swelling.

## 2015-11-29 NOTE — ED Provider Notes (Signed)
CSN: [id number]     Arrival date & time 11/29/15  1412 History   First MD Initiated Contact with Patient 11/29/15 1605     Chief Complaint  Patient presents with  . Otalgia   (Consider location/radiation/quality/duration/timing/severity/associated sxs/prior Treatment) HPI Comments: 45 year old female complaining of right ear pain for 9-10 days. She was seen in the emergency department a few days after the onset was diagnosed with retraction of the right TM and prescribed Keflex and Ultram for pain. In addition to the year complaint she had some swelling along the right lateral mandible. She has been compliant with her Keflex but her pain and swelling has persisted. Feel tram is not helping with her pain. Denies sore throat or toothache. Denies fever or chills. There is some discomfort when chewing and biting down in the right jaw and/or teeth. She says she cannot quite be sure.   Past Medical History  Diagnosis Date  . Lupus (HCC)   . Rheumatoid aortitis (HCC)   . Fibromyalgia    Past Surgical History  Procedure Laterality Date  . Neck surgery    . Tubal ligation    . Ablation    . Achilles tendon repair     History reviewed. No pertinent family history. Social History  Substance Use Topics  . Smoking status: Never Smoker   . Smokeless tobacco: None  . Alcohol Use: No   OB History    No data available     Review of Systems  Constitutional: Negative for fever, activity change and fatigue.  HENT: Positive for ear pain and facial swelling. Negative for congestion, dental problem, ear discharge, postnasal drip, rhinorrhea, sore throat and trouble swallowing.   Respiratory: Negative.  Negative for cough and shortness of breath.   Cardiovascular: Negative.   Musculoskeletal: Negative.   Skin: Negative.   Neurological: Negative.   All other systems reviewed and are negative.   Allergies  Review of patient's allergies indicates no known allergies.  Home Medications   Prior  to Admission medications   Medication Sig Start Date End Date Taking? Authorizing Provider  amoxicillin-clavulanate (AUGMENTIN) 875-125 MG tablet Take 1 tablet by mouth every 12 (twelve) hours. 11/29/15   Hayden Rasmussen, NP  cephALEXin (KEFLEX) 500 MG capsule Take 1 capsule (500 mg total) by mouth 4 (four) times daily. 11/24/15   Burgess Amor, PA-C  gabapentin (NEURONTIN) 300 MG capsule Take 300 mg by mouth 3 (three) times daily. 06/08/14   Historical Provider, MD  HYDROcodone-acetaminophen (NORCO/VICODIN) 5-325 MG tablet Take 1 tablet by mouth every 4 (four) hours as needed. 11/29/15   Hayden Rasmussen, NP  hydroxychloroquine (PLAQUENIL) 200 MG tablet Take 200 mg by mouth 2 (two) times daily. 09/02/14   Historical Provider, MD  lisinopril (PRINIVIL,ZESTRIL) 20 MG tablet Take 20 mg by mouth daily. 06/08/14   Historical Provider, MD  methocarbamol (ROBAXIN) 750 MG tablet Take 750 mg by mouth 4 (four) times daily.    Historical Provider, MD  traMADol (ULTRAM) 50 MG tablet Take 1 tablet (50 mg total) by mouth every 6 (six) hours as needed. 11/24/15   Burgess Amor, PA-C  traZODone (DESYREL) 100 MG tablet Take 100 mg by mouth at bedtime. 06/08/14   Historical Provider, MD   Meds Ordered and Administered this Visit  Medications - No data to display  BP 161/93 mmHg  Pulse 97  Temp(Src) 98.7 F (37.1 C) (Oral)  Resp 12  SpO2 97% No data found.   Physical Exam  Constitutional: She is oriented to  person, place, and time. She appears well-developed and well-nourished. No distress.  HENT:  Mouth/Throat: No oropharyngeal exudate.  Left TM minor retraction. No erythema or effusion. Right TM with moderate retraction. No erythema. No drainage. EAC is clear. No tenderness. Palpation of the soft tissues along the right lateral mandible produces tenderness. There is minor facial swelling to this area. The oropharynx is clear. There is no dental tenderness or perceived dental pain. No gingival swelling or tenderness. No buccal  lesions. Palpation of the pterygoid muscle reveals no tenderness nor is there tenderness to other structures to the right side of the mouth or jaw. No lesions are seen. No erythema to the face or in the oral cavity. No edema to the lips, tongue or other intraoral structures. Normal swallowing reflex.  Eyes: Conjunctivae and EOM are normal.  Neck: Normal range of motion. Neck supple.  No observed or palpable lymphadenopathy to the submental, submandibular or anterior or posterior chains. No postauricular or preauricular nodes.  Cardiovascular: Normal rate, regular rhythm and normal heart sounds.   Pulmonary/Chest: Effort normal. No respiratory distress.  Musculoskeletal: Normal range of motion.  Lymphadenopathy:    She has no cervical adenopathy.  Neurological: She is alert and oriented to person, place, and time. She exhibits normal muscle tone.  Skin: Skin is warm and dry. No rash noted. No erythema.  Psychiatric: She has a normal mood and affect.  Nursing note and vitals reviewed.   ED Course  Procedures (including critical care time)  Labs Review Labs Reviewed - No data to display  Imaging Review No results found.   Visual Acuity Review  Right Eye Distance:   Left Eye Distance:   Bilateral Distance:    Right Eye Near:   Left Eye Near:    Bilateral Near:         MDM   1. Sialadenitis   2. ETD (eustachian tube dysfunction), right   3. Barotitis media, subsequent encounter    Possible Salivary Gland Infection Stop the Keflex and start Augmentin Lemon drops frequently Stop the Ultram and use Norco for pain Warm moist heat to side of jaw. Sudafed PE 10 mg for decongestant and  Claritin or Zyrtec for drainage.    Hayden Rasmussen, NP 11/29/15 1654  Hayden Rasmussen, NP 11/29/15 [phone]

## 2016-11-27 ENCOUNTER — Encounter (HOSPITAL_COMMUNITY): Payer: Self-pay | Admitting: Emergency Medicine

## 2016-11-27 ENCOUNTER — Emergency Department (HOSPITAL_COMMUNITY)
Admission: EM | Admit: 2016-11-27 | Discharge: 2016-11-27 | Disposition: A | Discharge status: Home / Self Care | Payer: Managed Care, Other (non HMO) | Attending: Emergency Medicine | Admitting: Emergency Medicine

## 2016-11-27 DIAGNOSIS — K047 Periapical abscess without sinus: Secondary | ICD-10-CM | POA: Diagnosis not present

## 2016-11-27 DIAGNOSIS — K029 Dental caries, unspecified: Secondary | ICD-10-CM | POA: Diagnosis not present

## 2016-11-27 DIAGNOSIS — K0889 Other specified disorders of teeth and supporting structures: Secondary | ICD-10-CM | POA: Diagnosis present

## 2016-11-27 MED ORDER — CLINDAMYCIN HCL 300 MG PO CAPS: 300.0000 mg | ORAL_CAPSULE | Freq: Four times a day (QID) | ORAL | 0 refills | Status: DC

## 2016-11-27 MED ORDER — HYDROCODONE-ACETAMINOPHEN 5-325 MG PO TABS: ORAL_TABLET | ORAL | 0 refills | Status: DC

## 2016-11-27 NOTE — ED Provider Notes (Signed)
[facility]-EMERGENCY DEPT Provider Note   CSN: [id number] Arrival date & time: 11/27/16  0754     History   Chief Complaint Chief Complaint  Patient presents with  . Dental Pain    HPI Veronica Nguyen is a 46 y.o. female.  HPI   Veronica Nguyen is a 46 y.o. female who presents to the Emergency Department complaining of left upper tooth pain and facial swelling.  She reports having increasing pain to her left upper second molar for several days and woke this morning with swelling along her left cheek.  She describes a sharp throbbing pain that radiates from her tooth to her left ear.  She is taking ibuprofen with some relief.  She denies fever, chills, neck pain, difficulty swallowing.  States she dentist office is closed today.     Past Medical History:  Diagnosis Date  . Fibromyalgia   . Lupus   . Rheumatoid aortitis     Patient Active Problem List   Diagnosis Date Noted  . Right wrist fracture 09/08/2014  . Distal radius fracture 07/28/2014    Past Surgical History:  Procedure Laterality Date  . ABLATION    . ACHILLES TENDON REPAIR    . NECK SURGERY    . TUBAL LIGATION      OB History    No data available       Home Medications    Prior to Admission medications   Medication Sig Start Date End Date Taking? Authorizing Provider  amoxicillin-clavulanate (AUGMENTIN) 875-125 MG tablet Take 1 tablet by mouth every 12 (twelve) hours. 11/29/15   Hayden Rasmussen, NP  cephALEXin (KEFLEX) 500 MG capsule Take 1 capsule (500 mg total) by mouth 4 (four) times daily. 11/24/15   Burgess Amor, PA-C  gabapentin (NEURONTIN) 300 MG capsule Take 300 mg by mouth 3 (three) times daily. 06/08/14   Historical Provider, MD  HYDROcodone-acetaminophen (NORCO/VICODIN) 5-325 MG tablet Take 1 tablet by mouth every 4 (four) hours as needed. 11/29/15   Hayden Rasmussen, NP  hydroxychloroquine (PLAQUENIL) 200 MG tablet Take 200 mg by mouth 2 (two) times daily. 09/02/14   Historical Provider, MD  lisinopril  (PRINIVIL,ZESTRIL) 20 MG tablet Take 20 mg by mouth daily. 06/08/14   Historical Provider, MD  methocarbamol (ROBAXIN) 750 MG tablet Take 750 mg by mouth 4 (four) times daily.    Historical Provider, MD  traMADol (ULTRAM) 50 MG tablet Take 1 tablet (50 mg total) by mouth every 6 (six) hours as needed. 11/24/15   Burgess Amor, PA-C  traZODone (DESYREL) 100 MG tablet Take 100 mg by mouth at bedtime. 06/08/14   Historical Provider, MD    Family History History reviewed. No pertinent family history.  Social History Social History  Substance Use Topics  . Smoking status: Never Smoker  . Smokeless tobacco: Never Used  . Alcohol use No     Allergies   Patient has no known allergies.   Review of Systems Review of Systems  Constitutional: Negative for appetite change and fever.  HENT: Positive for dental problem and facial swelling. Negative for congestion, sore throat and trouble swallowing.   Eyes: Negative for pain and visual disturbance.  Respiratory: Negative for shortness of breath.   Musculoskeletal: Negative for neck pain and neck stiffness.  Neurological: Negative for dizziness, facial asymmetry and headaches.  Hematological: Negative for adenopathy.  All other systems reviewed and are negative.    Physical Exam Updated Vital Signs BP (!) 157/108 (BP Location: Left Arm)   Pulse  86   Temp 97.8 F (36.6 C) (Oral)   Resp 20   Ht 5\' 3"  (1.6 m)   Wt 104.3 kg   SpO2 98%   BMI 40.74 kg/m   Physical Exam  Constitutional: She is oriented to person, place, and time. She appears well-developed and well-nourished. No distress.  HENT:  Head: Normocephalic and atraumatic.  Right Ear: Tympanic membrane and ear canal normal.  Left Ear: Tympanic membrane and ear canal normal.  Mouth/Throat: Uvula is midline, oropharynx is clear and moist and mucous membranes are normal. No trismus in the jaw. Dental caries present. No dental abscesses or uvula swelling.  Tenderness and dental caries  of the left upper second molar, mild swelling of the adjacent gingiva.  Mild edema of the left upper face. No trismus, or sublingual abnml.    Eyes: EOM are normal. Pupils are equal, round, and reactive to light.  Neck: Normal range of motion. Neck supple.  Cardiovascular: Normal rate, regular rhythm and normal heart sounds.   No murmur heard. Pulmonary/Chest: Effort normal and breath sounds normal.  Musculoskeletal: Normal range of motion.  Lymphadenopathy:    She has no cervical adenopathy.  Neurological: She is alert and oriented to person, place, and time. She exhibits normal muscle tone. Coordination normal.  Skin: Skin is warm and dry.  Nursing note and vitals reviewed.    ED Treatments / Results  Labs (all labs ordered are listed, but only abnormal results are displayed) Labs Reviewed - No data to display  EKG  EKG Interpretation None       Radiology No results found.  Procedures Procedures (including critical care time)  Medications Ordered in ED Medications - No data to display   Initial Impression / Assessment and Plan / ED Course  I have reviewed the triage vital signs and the nursing notes.  Pertinent labs & imaging results that were available during my care of the patient were reviewed by me and considered in my medical decision making (see chart for details).  Clinical Course     Pt with developing dental abscess.  Nothing drainable at present.  Airway is patent.  No concerning sx's for Ludwig's angina.  States her dentist will see her tomorrow as a walk-in.  Rx for clinda and #10 vicodin  Final Clinical Impressions(s) / ED Diagnoses   Final diagnoses:  Dental abscess    New Prescriptions New Prescriptions   No medications on file     11/27/16 11/29/16    [id number], MD 11/27/16 2008

## 2016-11-27 NOTE — Discharge Instructions (Signed)
Be sure to follow-up with your dentist this week

## 2016-11-27 NOTE — ED Triage Notes (Signed)
Patient complaining of left upper dental pain x 2 days.

## 2017-12-23 ENCOUNTER — Emergency Department (HOSPITAL_COMMUNITY)
Admission: EM | Admit: 2017-12-23 | Discharge: 2017-12-23 | Disposition: A | Discharge status: Home / Self Care | Payer: 59 | Attending: Emergency Medicine | Admitting: Emergency Medicine

## 2017-12-23 ENCOUNTER — Other Ambulatory Visit: Payer: Self-pay

## 2017-12-23 ENCOUNTER — Emergency Department (HOSPITAL_COMMUNITY): Payer: 59

## 2017-12-23 ENCOUNTER — Encounter (HOSPITAL_COMMUNITY): Payer: Self-pay | Admitting: Emergency Medicine

## 2017-12-23 DIAGNOSIS — R112 Nausea with vomiting, unspecified: Secondary | ICD-10-CM | POA: Diagnosis not present

## 2017-12-23 DIAGNOSIS — Z79899 Other long term (current) drug therapy: Secondary | ICD-10-CM | POA: Insufficient documentation

## 2017-12-23 DIAGNOSIS — R1013 Epigastric pain: Secondary | ICD-10-CM | POA: Diagnosis not present

## 2017-12-23 DIAGNOSIS — R109 Unspecified abdominal pain: Secondary | ICD-10-CM | POA: Diagnosis present

## 2017-12-23 LAB — BASIC METABOLIC PANEL
Anion gap: 10 (ref 5–15)
BUN: 14 mg/dL (ref 6–20)
CALCIUM: 9.6 mg/dL (ref 8.9–10.3)
CHLORIDE: 105 mmol/L (ref 101–111)
CO2: 25 mmol/L (ref 22–32)
CREATININE: 0.73 mg/dL (ref 0.44–1.00)
Glucose, Bld: 89 mg/dL (ref 65–99)
Potassium: 3.6 mmol/L (ref 3.5–5.1)
SODIUM: 140 mmol/L (ref 135–145)

## 2017-12-23 LAB — HEPATIC FUNCTION PANEL
ALT: 13 U/L — AB (ref 14–54)
AST: 17 U/L (ref 15–41)
Albumin: 4 g/dL (ref 3.5–5.0)
Alkaline Phosphatase: 82 U/L (ref 38–126)
BILIRUBIN DIRECT: 0.1 mg/dL (ref 0.1–0.5)
Indirect Bilirubin: 0.2 mg/dL — ABNORMAL LOW (ref 0.3–0.9)
TOTAL PROTEIN: 7.8 g/dL (ref 6.5–8.1)
Total Bilirubin: 0.3 mg/dL (ref 0.3–1.2)

## 2017-12-23 LAB — TROPONIN I

## 2017-12-23 LAB — CBC
HCT: 43 % (ref 36.0–46.0)
Hemoglobin: 14.3 g/dL (ref 12.0–15.0)
MCH: 29.2 pg (ref 26.0–34.0)
MCHC: 33.3 g/dL (ref 30.0–36.0)
MCV: 87.9 fL (ref 78.0–100.0)
PLATELETS: 315 10*3/uL (ref 150–400)
RBC: 4.89 MIL/uL (ref 3.87–5.11)
RDW: 13.2 % (ref 11.5–15.5)
WBC: 11.3 10*3/uL — AB (ref 4.0–10.5)

## 2017-12-23 LAB — LIPASE, BLOOD: LIPASE: 32 U/L (ref 11–51)

## 2017-12-23 MED ORDER — HYDROMORPHONE HCL 1 MG/ML IJ SOLN
0.5000 mg | Freq: Once | INTRAMUSCULAR | Status: AC
  Administered 2017-12-23: 0.5 mg via INTRAVENOUS
  Filled 2017-12-23: qty 1

## 2017-12-23 MED ORDER — ONDANSETRON HCL 4 MG/2ML IJ SOLN
4.0000 mg | Freq: Once | INTRAMUSCULAR | Status: AC
  Administered 2017-12-23: 4 mg via INTRAVENOUS
  Filled 2017-12-23: qty 2

## 2017-12-23 MED ORDER — FAMOTIDINE 20 MG PO TABS: 20.0000 mg | ORAL_TABLET | Freq: Two times a day (BID) | ORAL | 0 refills | Status: DC

## 2017-12-23 MED ORDER — PROMETHAZINE HCL 25 MG PO TABS: 25.0000 mg | ORAL_TABLET | Freq: Four times a day (QID) | ORAL | 1 refills | Status: DC | PRN

## 2017-12-23 MED ORDER — SODIUM CHLORIDE 0.9 % IV BOLUS (SEPSIS)
1000.0000 mL | Freq: Once | INTRAVENOUS | Status: AC
  Administered 2017-12-23: 1000 mL via INTRAVENOUS

## 2017-12-23 MED ORDER — IOPAMIDOL (ISOVUE-300) INJECTION 61%
100.0000 mL | Freq: Once | INTRAVENOUS | Status: AC | PRN
  Administered 2017-12-23: 100 mL via INTRAVENOUS

## 2017-12-23 MED ORDER — SODIUM CHLORIDE 0.9 % IV SOLN: INTRAVENOUS | Status: DC

## 2017-12-23 NOTE — Discharge Instructions (Signed)
Take the Phenergan as needed for nausea and vomiting.  Take the Pepcid for the next 2 weeks.  CT scan without any acute findings.  If symptoms persist will need additional workup for perhaps a dysfunctional gallbladder and or peptic ulcer.

## 2017-12-23 NOTE — ED Provider Notes (Signed)
[facility] EMERGENCY DEPARTMENT Provider Note   CSN: [id number] Arrival date & time: 12/23/17  1408     History   Chief Complaint Chief Complaint  Patient presents with  . Abdominal Pain    HPI Veronica Nguyen is a 47 y.o. female.  Patient with onset of epigastric abdominal pain it 8:00 this morning.  That radiates to the back.  Associated with nausea but no vomiting.  No fevers.  Slight shortness of breath but no severe shortness of breath.  No lower extremity swelling.  Patient at home I did baking soda and water with no relief.  No fevers.  No diarrhea.  No history of similar pain.  Patient still has her gallbladder.  As stated pain radiates from the epigastric area directly to the back area.      Past Medical History:  Diagnosis Date  . Fibromyalgia   . Lupus   . Rheumatoid aortitis     Patient Active Problem List   Diagnosis Date Noted  . Right wrist fracture 09/08/2014  . Distal radius fracture 07/28/2014    Past Surgical History:  Procedure Laterality Date  . ABLATION    . ACHILLES TENDON REPAIR    . NECK SURGERY    . TUBAL LIGATION      OB History    No data available       Home Medications    Prior to Admission medications   Medication Sig Start Date End Date Taking? Authorizing Provider  amoxicillin-clavulanate (AUGMENTIN) 875-125 MG tablet Take 1 tablet by mouth every 12 (twelve) hours. 11/29/15   Hayden Rasmussen, NP  cephALEXin (KEFLEX) 500 MG capsule Take 1 capsule (500 mg total) by mouth 4 (four) times daily. 11/24/15   Burgess Amor, PA-C  clindamycin (CLEOCIN) 300 MG capsule Take 1 capsule (300 mg total) by mouth 4 (four) times daily. 11/27/16   Triplett, Tammy, PA-C  famotidine (PEPCID) 20 MG tablet Take 1 tablet (20 mg total) by mouth 2 (two) times daily. 12/23/17   Vanetta Mulders, MD  gabapentin (NEURONTIN) 300 MG capsule Take 300 mg by mouth 3 (three) times daily. 06/08/14   [provider]  HYDROcodone-acetaminophen (NORCO/VICODIN)  5-325 MG tablet Take one-two tabs po q 4-6 hrs prn pain 11/27/16   Triplett, Tammy, PA-C  hydroxychloroquine (PLAQUENIL) 200 MG tablet Take 200 mg by mouth 2 (two) times daily. 09/02/14   [provider]  lisinopril (PRINIVIL,ZESTRIL) 20 MG tablet Take 20 mg by mouth daily. 06/08/14   [provider]  methocarbamol (ROBAXIN) 750 MG tablet Take 750 mg by mouth 4 (four) times daily.    [provider]  promethazine (PHENERGAN) 25 MG tablet Take 1 tablet (25 mg total) by mouth every 6 (six) hours as needed. 12/23/17   Vanetta Mulders, MD  traMADol (ULTRAM) 50 MG tablet Take 1 tablet (50 mg total) by mouth every 6 (six) hours as needed. 11/24/15   Burgess Amor, PA-C  traZODone (DESYREL) 100 MG tablet Take 100 mg by mouth at bedtime. 06/08/14   [provider]    Family History No family history on file.  Social History Social History   Tobacco Use  . Smoking status: Never Smoker  . Smokeless tobacco: Never Used  Substance Use Topics  . Alcohol use: No  . Drug use: No     Allergies   Patient has no known allergies.   Review of Systems Review of Systems  Constitutional: Negative for fever.  HENT: Negative for congestion.  Respiratory: Positive for shortness of breath.   Cardiovascular: Negative for chest pain.  Gastrointestinal: Positive for abdominal pain and nausea. Negative for diarrhea and vomiting.  Genitourinary: Negative for dysuria.  Musculoskeletal: Positive for back pain.  Neurological: Negative for headaches.  Hematological: Does not bruise/bleed easily.  Psychiatric/Behavioral: Negative for confusion.     Physical Exam Updated Vital Signs BP (!) 168/89   Pulse 78   Temp 98.6 F (37 C) (Oral)   Resp 20   Ht 1.6 m (5\' 3" )   Wt 108.9 kg (240 lb)   SpO2 96%   BMI 42.51 kg/m   Physical Exam  Constitutional: She is oriented to person, place, and time. She appears well-developed and well-nourished. She does not appear ill.    HENT:  Head: Normocephalic and atraumatic.  Mouth/Throat: Oropharynx is clear and moist.  Eyes: EOM are normal. Pupils are equal, round, and reactive to light.  Neck: Normal range of motion. Neck supple.  Cardiovascular: Normal rate, regular rhythm and normal heart sounds.  Pulmonary/Chest: Effort normal and breath sounds normal. No respiratory distress. She has no wheezes.  Abdominal: Soft. Bowel sounds are normal. There is tenderness.  Mild tenderness to palpation right upper quadrant epigastric area.  No left upper quadrant tenderness.  Musculoskeletal: Normal range of motion. She exhibits no edema.  Neurological: She is alert and oriented to person, place, and time. No cranial nerve deficit or sensory deficit. She exhibits normal muscle tone. Coordination normal.  Skin: Skin is warm. Rash noted.  Nursing note and vitals reviewed.    ED Treatments / Results  Labs (all labs ordered are listed, but only abnormal results are displayed) Labs Reviewed  CBC - Abnormal; Notable for the following components:      Result Value   WBC 11.3 (*)    All other components within normal limits  HEPATIC FUNCTION PANEL - Abnormal; Notable for the following components:   ALT 13 (*)    Indirect Bilirubin 0.2 (*)    All other components within normal limits  BASIC METABOLIC PANEL  TROPONIN I  LIPASE, BLOOD  TROPONIN I    EKG  EKG Interpretation  Date/Time:  Sunday December 23 2017 20:12:15 EST Ventricular Rate:  75 PR Interval:    QRS Duration: 88 QT Interval:  386 QTC Calculation: 432 R Axis:   21 Text Interpretation:  Sinus rhythm No significant change since last tracing Confirmed by 06-07-2001 [phone]) on 12/23/2017 8:28:29 PM       Radiology Dg Chest 2 View  Result Date: 12/23/2017 CLINICAL DATA:  Epigastric region pain EXAM: CHEST  2 VIEW COMPARISON:  None. FINDINGS: Lungs are clear. Heart is upper normal in size with pulmonary vascularity within normal limits. No  adenopathy. There is postoperative change in the lower cervical region. No pneumothorax. IMPRESSION: No edema or consolidation. Electronically Signed   By: 02/20/2018 III M.D.   On: 12/23/2017 14:46   Ct Abdomen Pelvis W Contrast  Result Date: 12/23/2017 CLINICAL DATA:  47 y/o  F; 3 hours of epigastric pain. EXAM: CT ABDOMEN AND PELVIS WITH CONTRAST TECHNIQUE: Multidetector CT imaging of the abdomen and pelvis was performed using the standard protocol following bolus administration of intravenous contrast. CONTRAST:  49 ISOVUE-300 IOPAMIDOL (ISOVUE-300) INJECTION 61% COMPARISON:  None. FINDINGS: Lower chest: No acute abnormality. Hepatobiliary: No focal liver abnormality is seen. No gallstones, gallbladder wall thickening, or biliary dilatation. Pancreas: Unremarkable. No pancreatic ductal dilatation or surrounding inflammatory changes. Spleen: Normal in size without focal  abnormality. Adrenals/Urinary Tract: Adrenal glands are unremarkable. Kidneys are normal, without renal calculi, focal lesion, or hydronephrosis. Bladder is unremarkable. Stomach/Bowel: Stomach is within normal limits. Appendix appears normal. No evidence of bowel wall thickening, distention, or inflammatory changes. Vascular/Lymphatic: No significant vascular findings are present. No enlarged abdominal or pelvic lymph nodes. Reproductive: 3.3 cm homogeneous fluid attenuating round cyst in left ovary. Bilateral tubal ligation. Other: No abdominal wall hernia or abnormality. No abdominopelvic ascites. Musculoskeletal: No acute or significant osseous findings. IMPRESSION: No acute process identified.  Unremarkable CT of abdomen and pelvis. Electronically Signed   By: Mitzi Hansen M.D.   On: 12/23/2017 22:44    Procedures Procedures (including critical care time)  Medications Ordered in ED Medications  0.9 %  sodium chloride infusion ( Intravenous Hold 12/23/17 2135)  sodium chloride 0.9 % bolus 1,000 mL (0 mLs  Intravenous Stopped 12/23/17 2313)  ondansetron (ZOFRAN) injection 4 mg (4 mg Intravenous Given 12/23/17 2131)  HYDROmorphone (DILAUDID) injection 0.5 mg (0.5 mg Intravenous Given 12/23/17 2131)  iopamidol (ISOVUE-300) 61 % injection 100 mL (100 mLs Intravenous Contrast Given 12/23/17 2218)     Initial Impression / Assessment and Plan / ED Course  I have reviewed the triage vital signs and the nursing notes.  Pertinent labs & imaging results that were available during my care of the patient were reviewed by me and considered in my medical decision making (see chart for details).    Workup negative from a cardiac standpoint.  Troponins x2-.  Chest x-ray without any acute findings.  Patient's labs without significant abnormal other than a mild leukocytosis of 11,000.  Concern was perhaps for gallbladder disease.  So patient got CT scan of the abdomen.  But everything was negative.  Still possible could be a dysfunctional gallbladder.  Still possible could be peptic ulcer disease.  Patient will be treated with Pepcid for the next 2 weeks.  And will follow up with her doctor.   Final Clinical Impressions(s) / ED Diagnoses   Final diagnoses:  Epigastric pain    ED Discharge Orders        Ordered    promethazine (PHENERGAN) 25 MG tablet  Every 6 hours PRN     12/23/17 2313    famotidine (PEPCID) 20 MG tablet  2 times daily     12/23/17 2313       Vanetta Mulders, MD 12/24/17 0021

## 2017-12-23 NOTE — ED Triage Notes (Signed)
Patient c/o epigastric pain that started 3 hours ago. Patient states she was watching TV when it started. Per patient she took tums and baking soda and water with no relief. Patient states pain radiates to back. Per patient some nausea and shortness of breath. Denies any vomiting, diarrhea, or fevers. No cardiac hx.

## 2018-11-26 ENCOUNTER — Encounter (HOSPITAL_COMMUNITY): Payer: Self-pay | Admitting: *Deleted

## 2018-11-26 ENCOUNTER — Other Ambulatory Visit: Payer: Self-pay

## 2018-11-26 DIAGNOSIS — Z79899 Other long term (current) drug therapy: Secondary | ICD-10-CM | POA: Diagnosis not present

## 2018-11-26 DIAGNOSIS — M545 Low back pain: Secondary | ICD-10-CM | POA: Insufficient documentation

## 2018-11-26 DIAGNOSIS — R200 Anesthesia of skin: Secondary | ICD-10-CM | POA: Diagnosis not present

## 2018-11-26 NOTE — ED Triage Notes (Signed)
Pt c/o mid and lower back pain with pain going down her leg and numbness to her great left toe; pt denies any injury

## 2018-11-27 ENCOUNTER — Emergency Department (HOSPITAL_COMMUNITY)
Admission: EM | Admit: 2018-11-27 | Discharge: 2018-11-27 | Disposition: A | Discharge status: Home / Self Care | Payer: 59 | Attending: Emergency Medicine | Admitting: Emergency Medicine

## 2018-11-27 ENCOUNTER — Emergency Department (HOSPITAL_COMMUNITY): Payer: 59

## 2018-11-27 DIAGNOSIS — M545 Low back pain, unspecified: Secondary | ICD-10-CM

## 2018-11-27 LAB — URINALYSIS, ROUTINE W REFLEX MICROSCOPIC
Bacteria, UA: NONE SEEN
Bilirubin Urine: NEGATIVE
GLUCOSE, UA: NEGATIVE mg/dL
Ketones, ur: NEGATIVE mg/dL
Leukocytes, UA: NEGATIVE
Nitrite: NEGATIVE
PROTEIN: NEGATIVE mg/dL
pH: 7 (ref 5.0–8.0)

## 2018-11-27 LAB — I-STAT CHEM 8, ED
BUN: 15 mg/dL (ref 6–20)
Calcium, Ion: 1.16 mmol/L (ref 1.15–1.40)
Chloride: 104 mmol/L (ref 98–111)
Creatinine, Ser: 0.7 mg/dL (ref 0.44–1.00)
Glucose, Bld: 96 mg/dL (ref 70–99)
HEMATOCRIT: 38 % (ref 36.0–46.0)
HEMOGLOBIN: 12.9 g/dL (ref 12.0–15.0)
POTASSIUM: 4 mmol/L (ref 3.5–5.1)
SODIUM: 139 mmol/L (ref 135–145)
TCO2: 26 mmol/L (ref 22–32)

## 2018-11-27 LAB — CBC WITH DIFFERENTIAL/PLATELET
ABS IMMATURE GRANULOCYTES: 0.02 10*3/uL (ref 0.00–0.07)
BASOS ABS: 0 10*3/uL (ref 0.0–0.1)
BASOS PCT: 0 %
Eosinophils Absolute: 0.2 10*3/uL (ref 0.0–0.5)
Eosinophils Relative: 2 %
HCT: 39.2 % (ref 36.0–46.0)
HEMOGLOBIN: 12.8 g/dL (ref 12.0–15.0)
IMMATURE GRANULOCYTES: 0 %
LYMPHS PCT: 20 %
Lymphs Abs: 1.8 10*3/uL (ref 0.7–4.0)
MCH: 29.3 pg (ref 26.0–34.0)
MCHC: 32.7 g/dL (ref 30.0–36.0)
MCV: 89.7 fL (ref 80.0–100.0)
Monocytes Absolute: 0.5 10*3/uL (ref 0.1–1.0)
Monocytes Relative: 6 %
NEUTROS ABS: 6.4 10*3/uL (ref 1.7–7.7)
NEUTROS PCT: 72 %
PLATELETS: 297 10*3/uL (ref 150–400)
RBC: 4.37 MIL/uL (ref 3.87–5.11)
RDW: 13.4 % (ref 11.5–15.5)
WBC: 8.9 10*3/uL (ref 4.0–10.5)
nRBC: 0 % (ref 0.0–0.2)

## 2018-11-27 LAB — COMPREHENSIVE METABOLIC PANEL
ALBUMIN: 3.5 g/dL (ref 3.5–5.0)
ALK PHOS: 67 U/L (ref 38–126)
ALT: 13 U/L (ref 0–44)
AST: 13 U/L — AB (ref 15–41)
Anion gap: 8 (ref 5–15)
BUN: 14 mg/dL (ref 6–20)
CALCIUM: 8.3 mg/dL — AB (ref 8.9–10.3)
CHLORIDE: 105 mmol/L (ref 98–111)
CO2: 22 mmol/L (ref 22–32)
CREATININE: 0.6 mg/dL (ref 0.44–1.00)
GFR calc non Af Amer: >60 mL/min (ref >60)
GLUCOSE: 90 mg/dL (ref 70–99)
Potassium: 3.6 mmol/L (ref 3.5–5.1)
SODIUM: 135 mmol/L (ref 135–145)
Total Bilirubin: 0.7 mg/dL (ref 0.3–1.2)
Total Protein: 6.9 g/dL (ref 6.5–8.1)

## 2018-11-27 LAB — I-STAT TROPONIN, ED: TROPONIN I, POC: 0 ng/mL (ref 0.00–0.08)

## 2018-11-27 LAB — LIPASE, BLOOD: Lipase: 30 U/L (ref 11–51)

## 2018-11-27 MED ORDER — METHOCARBAMOL 500 MG PO TABS: 500.0000 mg | ORAL_TABLET | Freq: Three times a day (TID) | ORAL | 0 refills | Status: DC | PRN

## 2018-11-27 MED ORDER — FENTANYL CITRATE (PF) 100 MCG/2ML IJ SOLN
50.0000 ug | Freq: Once | INTRAMUSCULAR | Status: AC
  Administered 2018-11-27: 50 ug via INTRAVENOUS
  Filled 2018-11-27: qty 2

## 2018-11-27 MED ORDER — METHYLPREDNISOLONE 4 MG PO TBPK: ORAL_TABLET | ORAL | 0 refills | Status: DC

## 2018-11-27 MED ORDER — IOPAMIDOL (ISOVUE-370) INJECTION 76%
100.0000 mL | Freq: Once | INTRAVENOUS | Status: AC | PRN
  Administered 2018-11-27: 100 mL via INTRAVENOUS

## 2018-11-27 NOTE — Discharge Instructions (Addendum)
Your testing is reassuring.  Your aorta and gallbladder appear normal. As we discussed, you need an MRI of your back but this is not an emergency.  He should follow-up with your doctor and take the medications as prescribed.  Return to the ED with worsening pain, weakness, numbness, incontinence, fever or any other concerns.

## 2018-11-27 NOTE — ED Provider Notes (Signed)
[facility] EMERGENCY DEPARTMENT Provider Note   CSN: [id number] Arrival date & time: 11/26/18  2216     History   Chief Complaint Chief Complaint  Patient presents with  . Back Pain    HPI CAELI LINEHAN is a 48 y.o. female.  Patient with history of fibromyalgia and lupus presenting with 2-day history of mid and low back pain with some numbness in her left great toe.  She denies any falls or trauma or lifting injury.  States she woke up with pain 2 nights ago and has been progressively worsening.  Pain starts in her mid back and radiates down to her low back but not down her legs.  She is been taking ibuprofen and Voltaren at home with heat packs without relief.  Denies having this pain in the past.  She denies any weakness in her leg.  Does have some numbness in her left great toe only but not the leg itself.  No fevers, chills, nausea or vomiting.  No bowel or bladder incontinence.  No history of IV drug abuse or cancer.  States she has had neck surgery in the past but not on her back.   Back Pain  Associated symptoms: numbness   Associated symptoms: no chest pain, no dysuria, no headaches and no weakness     Past Medical History:  Diagnosis Date  . Fibromyalgia   . Lupus (HCC)   . Rheumatoid aortitis     Patient Active Problem List   Diagnosis Date Noted  . Right wrist fracture 09/08/2014  . Distal radius fracture 07/28/2014    Past Surgical History:  Procedure Laterality Date  . ABLATION    . ACHILLES TENDON REPAIR    . NECK SURGERY    . TUBAL LIGATION       OB History   No obstetric history on file.      Home Medications    Prior to Admission medications   Medication Sig Start Date End Date Taking? Authorizing Provider  amoxicillin-clavulanate (AUGMENTIN) 875-125 MG tablet Take 1 tablet by mouth every 12 (twelve) hours. 11/29/15   Hayden Rasmussen, NP  cephALEXin (KEFLEX) 500 MG capsule Take 1 capsule (500 mg total) by mouth 4 (four) times daily. 11/24/15    Burgess Amor, PA-C  clindamycin (CLEOCIN) 300 MG capsule Take 1 capsule (300 mg total) by mouth 4 (four) times daily. 11/27/16   Triplett, Tammy, PA-C  famotidine (PEPCID) 20 MG tablet Take 1 tablet (20 mg total) by mouth 2 (two) times daily. 12/23/17   Vanetta Mulders, MD  gabapentin (NEURONTIN) 300 MG capsule Take 300 mg by mouth 3 (three) times daily. 06/08/14   [provider]  HYDROcodone-acetaminophen (NORCO/VICODIN) 5-325 MG tablet Take one-two tabs po q 4-6 hrs prn pain 11/27/16   Triplett, Tammy, PA-C  hydroxychloroquine (PLAQUENIL) 200 MG tablet Take 200 mg by mouth 2 (two) times daily. 09/02/14   [provider]  lisinopril (PRINIVIL,ZESTRIL) 20 MG tablet Take 20 mg by mouth daily. 06/08/14   [provider]  methocarbamol (ROBAXIN) 750 MG tablet Take 750 mg by mouth 4 (four) times daily.    [provider]  promethazine (PHENERGAN) 25 MG tablet Take 1 tablet (25 mg total) by mouth every 6 (six) hours as needed. 12/23/17   Vanetta Mulders, MD  traMADol (ULTRAM) 50 MG tablet Take 1 tablet (50 mg total) by mouth every 6 (six) hours as needed. 11/24/15   Burgess Amor, PA-C  traZODone (DESYREL) 100 MG tablet Take 100  mg by mouth at bedtime. 06/08/14   [provider]    Family History History reviewed. No pertinent family history.  Social History Social History   Tobacco Use  . Smoking status: Never Smoker  . Smokeless tobacco: Never Used  Substance Use Topics  . Alcohol use: No  . Drug use: No     Allergies   Patient has no known allergies.   Review of Systems Review of Systems  Constitutional: Negative for activity change and appetite change.  HENT: Negative for congestion.   Respiratory: Negative for cough, chest tightness and shortness of breath.   Cardiovascular: Negative for chest pain.  Gastrointestinal: Negative for nausea and vomiting.  Genitourinary: Negative for dysuria and hematuria.  Musculoskeletal: Positive for  arthralgias, back pain and myalgias. Negative for neck pain and neck stiffness.  Skin: Negative for rash.  Neurological: Positive for numbness. Negative for dizziness, weakness and headaches.   all other systems are negative except as noted in the HPI and PMH.     Physical Exam Updated Vital Signs BP (!) 187/92 (BP Location: Right Arm)   Pulse 99   Temp 98 F (36.7 C) (Oral)   Resp 18   Ht 5\' 3"  (1.6 m)   Wt 104.3 kg   SpO2 99%   BMI 40.74 kg/m   Physical Exam Vitals signs and nursing note reviewed.  Constitutional:      General: She is not in acute distress.    Appearance: She is well-developed.  HENT:     Head: Normocephalic and atraumatic.     Mouth/Throat:     Pharynx: No oropharyngeal exudate.  Eyes:     Conjunctiva/sclera: Conjunctivae normal.     Pupils: Pupils are equal, round, and reactive to light.  Neck:     Musculoskeletal: Normal range of motion and neck supple.     Comments: No meningismus. Cardiovascular:     Rate and Rhythm: Normal rate and regular rhythm.     Heart sounds: Normal heart sounds. No murmur.  Pulmonary:     Effort: Pulmonary effort is normal. No respiratory distress.     Breath sounds: Normal breath sounds.  Abdominal:     Palpations: Abdomen is soft.     Tenderness: There is no abdominal tenderness. There is no guarding or rebound.  Musculoskeletal: Normal range of motion.        General: Tenderness present.     Comments: Paraspinal thoracic and lumbar tenderness bilaterally.  No midline tenderness  5/5 strength in bilateral lower extremities. Ankle plantar and dorsiflexion intact. Great toe extension intact bilaterally. +2 DP and PT pulses. +2 patellar reflexes bilaterally. Normal gait. Subjectively decreased sensation left great toe  Skin:    General: Skin is warm.  Neurological:     Mental Status: She is alert and oriented to person, place, and time.     Cranial Nerves: No cranial nerve deficit.     Motor: No abnormal muscle  tone.     Coordination: Coordination normal.     Comments: No ataxia on finger to nose bilaterally. No pronator drift. 5/5 strength throughout. CN 2-12 intact.Equal grip strength. Sensation intact.   Psychiatric:        Behavior: Behavior normal.      ED Treatments / Results  Labs (all labs ordered are listed, but only abnormal results are displayed) Labs Reviewed  URINALYSIS, ROUTINE W REFLEX MICROSCOPIC - Abnormal; Notable for the following components:      Result Value   Color, Urine  STRAW (*)    Specific Gravity, Urine >1.046 (*)    Hgb urine dipstick SMALL (*)    All other components within normal limits  COMPREHENSIVE METABOLIC PANEL - Abnormal; Notable for the following components:   Calcium 8.3 (*)    AST 13 (*)    All other components within normal limits  CBC WITH DIFFERENTIAL/PLATELET  LIPASE, BLOOD  I-STAT CHEM 8, ED  I-STAT TROPONIN, ED    EKG EKG Interpretation  Date/Time:  Wednesday November 27 2018 02:19:54 EST Ventricular Rate:  82 PR Interval:    QRS Duration: 79 QT Interval:  375 QTC Calculation: 438 R Axis:   23 Text Interpretation:  Sinus rhythm Probable left atrial enlargement Low voltage, precordial leads No significant change was found Confirmed by Glynn Octave [phone]) on 11/27/2018 2:30:46 AM   Radiology Ct Angio Chest/abd/pel For Dissection W And/or Wo Contrast  Result Date: 11/27/2018 CLINICAL DATA:  Mid and lower back pain with pain going down to the leg and numbness in the great toe. No injury. EXAM: CT ANGIOGRAPHY CHEST, ABDOMEN AND PELVIS TECHNIQUE: Multidetector CT imaging through the chest, abdomen and pelvis was performed using the standard protocol during bolus administration of intravenous contrast. Multiplanar reconstructed images and MIPs were obtained and reviewed to evaluate the vascular anatomy. CONTRAST:  ISOVUE-370 IOPAMIDOL (ISOVUE-370) INJECTION 76% COMPARISON:  None. FINDINGS: CTA CHEST FINDINGS Cardiovascular:  Noncontrast CT images of the chest demonstrate normal caliber thoracic aorta. No significant calcification. No intramural hematoma. Images obtained during arterial phase after contrast administration demonstrate normal caliber thoracic aorta. No aneurysm or dissection. Great vessel origins are patent. Good opacification of the central and segmental pulmonary arteries. No focal filling defects. No evidence of significant pulmonary embolus. Normal heart size. No pericardial effusions. Mediastinum/Nodes: No significant lymphadenopathy. Esophagus is decompressed. Lungs/Pleura: Linear scarring or atelectasis in the lung bases. No airspace disease or consolidation in the lungs. No pleural effusions. No pneumothorax. Airways are patent. Musculoskeletal: Postoperative changes in the lower cervical spine. Mild degenerative changes in the thoracic spine. No vertebral compression deformities. No depressed sternal or rib fractures. Review of the MIP images confirms the above findings. CTA ABDOMEN AND PELVIS FINDINGS VASCULAR Aorta: Normal caliber aorta without aneurysm, dissection, vasculitis or significant stenosis. Celiac: Patent without evidence of aneurysm, dissection, vasculitis or significant stenosis. SMA: Patent without evidence of aneurysm, dissection, vasculitis or significant stenosis. Renals: Both renal arteries are patent without evidence of aneurysm, dissection, vasculitis, fibromuscular dysplasia or significant stenosis. IMA: Patent without evidence of aneurysm, dissection, vasculitis or significant stenosis. Inflow: Patent without evidence of aneurysm, dissection, vasculitis or significant stenosis. Veins: No obvious venous abnormality within the limitations of this arterial phase study. Review of the MIP images confirms the above findings. NON-VASCULAR Hepatobiliary: Mild diffuse fatty infiltration of the liver. No focal liver abnormality is seen. No gallstones, gallbladder wall thickening, or biliary  dilatation. Pancreas: Unremarkable. No pancreatic ductal dilatation or surrounding inflammatory changes. Spleen: Normal in size without focal abnormality. Adrenals/Urinary Tract: Adrenal glands are unremarkable. Kidneys are normal, without renal calculi, focal lesion, or hydronephrosis. Bladder is unremarkable. Stomach/Bowel: Stomach is within normal limits. Appendix appears normal. No evidence of bowel wall thickening, distention, or inflammatory changes. Lymphatic: No significant lymphadenopathy. Reproductive: Uterus and bilateral adnexa are unremarkable. Surgical clips consistent with tubal ligations. Other: No abdominal wall hernia or abnormality. No abdominopelvic ascites. Musculoskeletal: No acute or significant osseous findings. Review of the MIP images confirms the above findings. IMPRESSION: No evidence of aneurysm or dissection in the  thoracic or abdominal aorta. No significant pulmonary embolus. No evidence of active pulmonary disease. No acute process demonstrated in the abdomen or pelvis. Diffuse fatty infiltration of the liver. Electronically Signed   By: Burman NievesWilliam  Stevens M.D.   On: 11/27/2018 03:58    Procedures Procedures (including critical care time)  Medications Ordered in ED Medications  fentaNYL (SUBLIMAZE) injection 50 mcg (has no administration in time range)     Initial Impression / Assessment and Plan / ED Course  I have reviewed the triage vital signs and the nursing notes.  Pertinent labs & imaging results that were available during my care of the patient were reviewed by me and considered in my medical decision making (see chart for details).    Mid and upper back pain for the past 2 days with numbness in left great toe.  She has equal strength and sensation on exam with equal radial pulses and reflexes.  Pain is higher than expected with usual sciatica type pain.hypertensive but did not take BP meds last night.   CTA obtained to evaluate for aortic dissection or  aneurysm.  This is negative.  No pulmonary embolism.  Gallbladder appears normal on CT scan as well.  Patient with no abdominal pain, vomiting or fever.  LFTs and lipase normal. Normal WBC. No abdominal pain.  We will treat empirically with steroids and anti-inflammatories and muscle relaxers.  Discussed with patient she may benefit from MRI but there is no emergent indication for MRI today.  Low suspicion for cord compression or cauda equina.  Follow up with PCP. Return precautions discussed.   Final Clinical Impressions(s) / ED Diagnoses   Final diagnoses:  Acute left-sided low back pain without sciatica    ED Discharge Orders    None       Ryatt Corsino, Jeannett SeniorStephen, MD 11/27/18 [phone]

## 2018-11-27 NOTE — ED Notes (Signed)
Water given  

## 2018-11-27 NOTE — ED Notes (Signed)
Pt reports she takes Lisinopril for BP but forgot to take her meds last night

## 2018-11-27 NOTE — ED Notes (Signed)
Pt ambulated to and from the bathroom a few times along,pt steady with walk no assistance needed

## 2018-12-17 ENCOUNTER — Other Ambulatory Visit: Payer: Self-pay

## 2018-12-17 ENCOUNTER — Encounter (HOSPITAL_COMMUNITY): Payer: Self-pay

## 2018-12-17 ENCOUNTER — Observation Stay (HOSPITAL_COMMUNITY)
Admission: EM | Admit: 2018-12-17 | Discharge: 2018-12-19 | Disposition: A | Discharge status: Home / Self Care | Payer: 59 | Attending: Internal Medicine | Admitting: Internal Medicine

## 2018-12-17 DIAGNOSIS — Z79899 Other long term (current) drug therapy: Secondary | ICD-10-CM | POA: Diagnosis not present

## 2018-12-17 DIAGNOSIS — I471 Supraventricular tachycardia, unspecified: Secondary | ICD-10-CM | POA: Diagnosis present

## 2018-12-17 DIAGNOSIS — M329 Systemic lupus erythematosus, unspecified: Secondary | ICD-10-CM | POA: Diagnosis present

## 2018-12-17 DIAGNOSIS — B349 Viral infection, unspecified: Secondary | ICD-10-CM

## 2018-12-17 DIAGNOSIS — E876 Hypokalemia: Secondary | ICD-10-CM | POA: Diagnosis present

## 2018-12-17 DIAGNOSIS — R21 Rash and other nonspecific skin eruption: Secondary | ICD-10-CM | POA: Diagnosis present

## 2018-12-17 DIAGNOSIS — I011 Acute rheumatic endocarditis: Secondary | ICD-10-CM | POA: Diagnosis present

## 2018-12-17 DIAGNOSIS — L932 Other local lupus erythematosus: Secondary | ICD-10-CM | POA: Diagnosis not present

## 2018-12-17 DIAGNOSIS — IMO0002 Reserved for concepts with insufficient information to code with codable children: Secondary | ICD-10-CM | POA: Diagnosis present

## 2018-12-17 DIAGNOSIS — I791 Aortitis in diseases classified elsewhere: Secondary | ICD-10-CM | POA: Diagnosis not present

## 2018-12-17 LAB — CBC WITH DIFFERENTIAL/PLATELET
ABS IMMATURE GRANULOCYTES: 0.09 10*3/uL — AB (ref 0.00–0.07)
Basophils Absolute: 0.1 10*3/uL (ref 0.0–0.1)
Basophils Relative: 0 %
Eosinophils Absolute: 0.1 10*3/uL (ref 0.0–0.5)
Eosinophils Relative: 1 %
HCT: 41 % (ref 36.0–46.0)
HEMOGLOBIN: 13.6 g/dL (ref 12.0–15.0)
Immature Granulocytes: 0 %
Lymphocytes Relative: 7 %
Lymphs Abs: 1.5 10*3/uL (ref 0.7–4.0)
MCH: 29 pg (ref 26.0–34.0)
MCHC: 33.2 g/dL (ref 30.0–36.0)
MCV: 87.4 fL (ref 80.0–100.0)
MONO ABS: 0.6 10*3/uL (ref 0.1–1.0)
Monocytes Relative: 3 %
Neutro Abs: 17.9 10*3/uL — ABNORMAL HIGH (ref 1.7–7.7)
Neutrophils Relative %: 89 %
Platelets: 345 10*3/uL (ref 150–400)
RBC: 4.69 MIL/uL (ref 3.87–5.11)
RDW: 13.3 % (ref 11.5–15.5)
WBC: 20.3 10*3/uL — ABNORMAL HIGH (ref 4.0–10.5)
nRBC: 0 % (ref 0.0–0.2)

## 2018-12-17 LAB — COMPREHENSIVE METABOLIC PANEL
ALT: 16 U/L (ref 0–44)
ANION GAP: 9 (ref 5–15)
AST: 16 U/L (ref 15–41)
Albumin: 3.8 g/dL (ref 3.5–5.0)
Alkaline Phosphatase: 65 U/L (ref 38–126)
BUN: 15 mg/dL (ref 6–20)
CO2: 20 mmol/L — ABNORMAL LOW (ref 22–32)
Calcium: 8.3 mg/dL — ABNORMAL LOW (ref 8.9–10.3)
Chloride: 101 mmol/L (ref 98–111)
Creatinine, Ser: 0.7 mg/dL (ref 0.44–1.00)
GFR calc Af Amer: >60 mL/min (ref >60)
GFR calc non Af Amer: >60 mL/min (ref >60)
Glucose, Bld: 136 mg/dL — ABNORMAL HIGH (ref 70–99)
Potassium: 3.1 mmol/L — ABNORMAL LOW (ref 3.5–5.1)
Sodium: 130 mmol/L — ABNORMAL LOW (ref 135–145)
Total Bilirubin: 0.7 mg/dL (ref 0.3–1.2)
Total Protein: 7.3 g/dL (ref 6.5–8.1)

## 2018-12-17 LAB — INFLUENZA PANEL BY PCR (TYPE A & B)
Influenza A By PCR: NEGATIVE
Influenza B By PCR: NEGATIVE

## 2018-12-17 LAB — GROUP A STREP BY PCR: Group A Strep by PCR: NOT DETECTED

## 2018-12-17 MED ORDER — IBUPROFEN 800 MG PO TABS
800.0000 mg | ORAL_TABLET | Freq: Once | ORAL | Status: AC
  Administered 2018-12-17: 800 mg via ORAL
  Filled 2018-12-17: qty 1

## 2018-12-17 NOTE — ED Triage Notes (Signed)
Pt reports rash (all over) wheels or hives that started on 12/05/2018. Pt saw doctor on 12/07/18 and he prescribed prednisone taper, 2 xanax (just for that day), and shot of solumedrol and bendadryl. Pt has taken meds as prescribed and rash is still present. Pt reports rash "burns". Pt reports fever that started today, highest at home was 103. Pt took tylenol at 7:30 pm tonight.

## 2018-12-17 NOTE — ED Provider Notes (Signed)
[facility] EMERGENCY DEPARTMENT Provider Note   CSN: [id number] Arrival date & time: 12/17/18  2106     History   Chief Complaint Chief Complaint  Patient presents with  . Rash    HPI Veronica Nguyen is a 48 y.o. female.  HPI   Veronica Nguyen is a 48 y.o. female with a history of lupus and fibromyalgia who presents to the Emergency Department complaining of persistent rash for 2 weeks and sudden onset of fever, chills, generalized body aches, and sore throat that started today.  She states that her rash was first noticed on her chest about 2 weeks ago she describes the rash as a burning sensation without itching or swelling.  She was seen at an urgent care and prescribed prednisone taper for 8 to 9 days she states that she completed the course of medication without improvement.  Today, she reports fever of 103 earlier today.  She has been taking Tylenol.  She also reports having sore throat and generalized body aches and she was concerned that her current symptoms may be related to the rash.  She denies any recent travel out of the country, new medications, chemical exposures or joint pains.  No headache, neck stiffness, chest pain, abdominal pain, shortness of breath, dysuria vomiting or diarrhea.    Past Medical History:  Diagnosis Date  . Fibromyalgia   . Lupus (HCC)   . Rheumatoid aortitis     Patient Active Problem List   Diagnosis Date Noted  . Right wrist fracture 09/08/2014  . Distal radius fracture 07/28/2014    Past Surgical History:  Procedure Laterality Date  . ABLATION    . ACHILLES TENDON REPAIR    . NECK SURGERY    . TUBAL LIGATION       OB History   No obstetric history on file.      Home Medications    Prior to Admission medications   Medication Sig Start Date End Date Taking? Authorizing Provider  amoxicillin-clavulanate (AUGMENTIN) 875-125 MG tablet Take 1 tablet by mouth every 12 (twelve) hours. 11/29/15   Hayden Rasmussen, NP  cephALEXin  (KEFLEX) 500 MG capsule Take 1 capsule (500 mg total) by mouth 4 (four) times daily. 11/24/15   Burgess Amor, PA-C  clindamycin (CLEOCIN) 300 MG capsule Take 1 capsule (300 mg total) by mouth 4 (four) times daily. 11/27/16   Novalee Horsfall, PA-C  famotidine (PEPCID) 20 MG tablet Take 1 tablet (20 mg total) by mouth 2 (two) times daily. 12/23/17   Vanetta Mulders, MD  gabapentin (NEURONTIN) 300 MG capsule Take 300 mg by mouth 3 (three) times daily. 06/08/14   [provider]  HYDROcodone-acetaminophen (NORCO/VICODIN) 5-325 MG tablet Take one-two tabs po q 4-6 hrs prn pain 11/27/16   Issaac Shipper, PA-C  hydroxychloroquine (PLAQUENIL) 200 MG tablet Take 200 mg by mouth 2 (two) times daily. 09/02/14   [provider]  lisinopril (PRINIVIL,ZESTRIL) 20 MG tablet Take 20 mg by mouth daily. 06/08/14   [provider]  methocarbamol (ROBAXIN) 500 MG tablet Take 1 tablet (500 mg total) by mouth every 8 (eight) hours as needed for muscle spasms. 11/27/18   Rancour, Jeannett Senior, MD  methylPREDNISolone (MEDROL DOSEPAK) 4 MG TBPK tablet As directed 11/27/18   Rancour, Jeannett Senior, MD  promethazine (PHENERGAN) 25 MG tablet Take 1 tablet (25 mg total) by mouth every 6 (six) hours as needed. 12/23/17   Vanetta Mulders, MD  traMADol (ULTRAM) 50 MG tablet Take 1 tablet (50 mg total)  by mouth every 6 (six) hours as needed. 11/24/15   Burgess AmorIdol, Julie, PA-C  traZODone (DESYREL) 100 MG tablet Take 100 mg by mouth at bedtime. 06/08/14   [provider]    Family History No family history on file.  Social History Social History   Tobacco Use  . Smoking status: Never Smoker  . Smokeless tobacco: Never Used  Substance Use Topics  . Alcohol use: No  . Drug use: No     Allergies   Patient has no known allergies.   Review of Systems Review of Systems  Constitutional: Positive for chills and fever. Negative for activity change and appetite change.  HENT: Positive for sore throat. Negative for  congestion, facial swelling, rhinorrhea, trouble swallowing and voice change.   Eyes: Negative for visual disturbance.  Respiratory: Negative for cough, shortness of breath, wheezing and stridor.   Cardiovascular: Negative for chest pain.  Gastrointestinal: Negative for abdominal pain, nausea and vomiting.  Genitourinary: Negative for decreased urine volume and dysuria.  Musculoskeletal: Positive for myalgias (Generalized body aches). Negative for arthralgias, neck pain and neck stiffness.  Skin: Positive for rash.  Neurological: Negative for dizziness, syncope, speech difficulty, weakness, numbness and headaches.  Hematological: Negative for adenopathy.  Psychiatric/Behavioral: Negative for confusion.     Physical Exam Updated Vital Signs BP (!) 167/78   Pulse (!) 133   Temp 100.3 F (37.9 C) (Oral)   Resp 18   Ht 5\' 3"  (1.6 m)   Wt 104.3 kg   SpO2 98%   BMI 40.74 kg/m   Physical Exam Vitals signs and nursing note reviewed.  Constitutional:      Appearance: Normal appearance. She is not toxic-appearing.  HENT:     Head: Atraumatic.     Right Ear: Tympanic membrane and ear canal normal.     Left Ear: Tympanic membrane and ear canal normal.     Mouth/Throat:     Mouth: Mucous membranes are moist.     Tongue: Lesions present.     Pharynx: Oropharynx is clear. Uvula midline. No oropharyngeal exudate, posterior oropharyngeal erythema or uvula swelling.     Comments: Geographic tongue.  No petechia or oral lesions of the buccal mucosa or pharynx Neck:     Musculoskeletal: Normal range of motion. No neck rigidity or muscular tenderness.  Cardiovascular:     Rate and Rhythm: Regular rhythm. Tachycardia present.     Pulses: Normal pulses.  Pulmonary:     Effort: Pulmonary effort is normal.     Breath sounds: Normal breath sounds.  Abdominal:     General: There is no distension.     Palpations: Abdomen is soft.     Tenderness: There is no abdominal tenderness.    Musculoskeletal: Normal range of motion.        General: No swelling or tenderness.  Lymphadenopathy:     Cervical: No cervical adenopathy.  Skin:    General: Skin is warm.     Capillary Refill: Capillary refill takes less than 2 seconds.     Findings: Rash present.     Comments: Scattered erythema macular rash to the face, chest, abdomen, bilateral upper and lower extremities.  No edema.  No petechia.    Neurological:     General: No focal deficit present.     Mental Status: She is alert.     Sensory: No sensory deficit.     Motor: No weakness.     Gait: Gait normal.      ED  Treatments / Results  Labs (all labs ordered are listed, but only abnormal results are displayed) Labs Reviewed  CBC WITH DIFFERENTIAL/PLATELET - Abnormal; Notable for the following components:      Result Value   WBC 20.3 (*)    Neutro Abs 17.9 (*)    Abs Immature Granulocytes 0.09 (*)    All other components within normal limits  COMPREHENSIVE METABOLIC PANEL - Abnormal; Notable for the following components:   Sodium 130 (*)    Potassium 3.1 (*)    CO2 20 (*)    Glucose, Bld 136 (*)    Calcium 8.3 (*)    All other components within normal limits  GROUP A STREP BY PCR  CULTURE, BLOOD (ROUTINE X 2)  CULTURE, BLOOD (ROUTINE X 2)  INFLUENZA PANEL BY PCR (TYPE A & B)  LACTIC ACID, PLASMA  ROCKY MTN SPOTTED FVR ABS PNL(IGG+IGM)  B. BURGDORFI ANTIBODIES  LACTIC ACID, PLASMA  MAGNESIUM  PHOSPHORUS    EKG None  Radiology No results found.  Procedures Procedures (including critical care time)  Medications Ordered in ED Medications  ibuprofen (ADVIL,MOTRIN) tablet 800 mg (800 mg Oral Given 12/17/18 2241)     Initial Impression / Assessment and Plan / ED Course  I have reviewed the triage vital signs and the nursing notes.  Pertinent labs & imaging results that were available during my care of the patient were reviewed by me and considered in my medical decision making (see chart for  details).     Patient with persistent rash for 2 weeks and sudden onset of flulike symptoms today.  Febrile here and tachycardic.  Fever improved after ibuprofen.  Elevated white count felt to be related to recent steroid dosing.  Patient is nontoxic appearing, no nuchal rigidity.  Pt also seen by Dr. Manus Gunning and care plan discussed.  Will give second liter of IVF's and get CXR, blood cultures, lactic. And RMSF titer.  Pt with fever, leukocytosis and remains tachycardic despite fluids .    Will consult hospitalist for admission.     0130  Consulted Dr. Robb Matar who agrees to admit.    Final Clinical Impressions(s) / ED Diagnoses   Final diagnoses:  Viral syndrome  Rash and nonspecific skin eruption    ED Discharge Orders    None       Pauline Aus, PA-C 12/18/18 0140    Glynn Octave, MD 12/18/18 [phone]

## 2018-12-17 NOTE — ED Triage Notes (Addendum)
Pt reports fever, chills, and sore throat came on suddenly today. Pt family member is flu positive

## 2018-12-18 ENCOUNTER — Emergency Department (HOSPITAL_COMMUNITY): Payer: 59

## 2018-12-18 ENCOUNTER — Other Ambulatory Visit: Payer: Self-pay

## 2018-12-18 ENCOUNTER — Encounter (HOSPITAL_COMMUNITY): Payer: Self-pay | Admitting: *Deleted

## 2018-12-18 DIAGNOSIS — R21 Rash and other nonspecific skin eruption: Secondary | ICD-10-CM

## 2018-12-18 DIAGNOSIS — B349 Viral infection, unspecified: Secondary | ICD-10-CM

## 2018-12-18 DIAGNOSIS — I011 Acute rheumatic endocarditis: Secondary | ICD-10-CM | POA: Diagnosis present

## 2018-12-18 DIAGNOSIS — E876 Hypokalemia: Secondary | ICD-10-CM | POA: Diagnosis present

## 2018-12-18 DIAGNOSIS — IMO0002 Reserved for concepts with insufficient information to code with codable children: Secondary | ICD-10-CM | POA: Diagnosis present

## 2018-12-18 DIAGNOSIS — M329 Systemic lupus erythematosus, unspecified: Secondary | ICD-10-CM | POA: Diagnosis present

## 2018-12-18 LAB — BASIC METABOLIC PANEL
Anion gap: 4 — ABNORMAL LOW (ref 5–15)
BUN: 11 mg/dL (ref 6–20)
CO2: 21 mmol/L — ABNORMAL LOW (ref 22–32)
Calcium: 7.6 mg/dL — ABNORMAL LOW (ref 8.9–10.3)
Chloride: 115 mmol/L — ABNORMAL HIGH (ref 98–111)
Creatinine, Ser: 0.6 mg/dL (ref 0.44–1.00)
GFR calc Af Amer: >60 mL/min (ref >60)
GFR calc non Af Amer: >60 mL/min (ref >60)
GLUCOSE: 85 mg/dL (ref 70–99)
Potassium: 3.9 mmol/L (ref 3.5–5.1)
Sodium: 140 mmol/L (ref 135–145)

## 2018-12-18 LAB — CBC WITH DIFFERENTIAL/PLATELET
ABS IMMATURE GRANULOCYTES: 0.06 10*3/uL (ref 0.00–0.07)
Basophils Absolute: 0 10*3/uL (ref 0.0–0.1)
Basophils Relative: 0 %
Eosinophils Absolute: 0.1 10*3/uL (ref 0.0–0.5)
Eosinophils Relative: 0 %
HCT: 36 % (ref 36.0–46.0)
Hemoglobin: 11.6 g/dL — ABNORMAL LOW (ref 12.0–15.0)
Immature Granulocytes: 0 %
Lymphocytes Relative: 15 %
Lymphs Abs: 2.1 10*3/uL (ref 0.7–4.0)
MCH: 29.4 pg (ref 26.0–34.0)
MCHC: 32.2 g/dL (ref 30.0–36.0)
MCV: 91.1 fL (ref 80.0–100.0)
Monocytes Absolute: 0.7 10*3/uL (ref 0.1–1.0)
Monocytes Relative: 5 %
NEUTROS ABS: 10.9 10*3/uL — AB (ref 1.7–7.7)
Neutrophils Relative %: 80 %
Platelets: 278 10*3/uL (ref 150–400)
RBC: 3.95 MIL/uL (ref 3.87–5.11)
RDW: 13.2 % (ref 11.5–15.5)
WBC: 13.9 10*3/uL — ABNORMAL HIGH (ref 4.0–10.5)
nRBC: 0 % (ref 0.0–0.2)

## 2018-12-18 LAB — MAGNESIUM: Magnesium: 1.7 mg/dL (ref 1.7–2.4)

## 2018-12-18 LAB — LACTIC ACID, PLASMA: LACTIC ACID, VENOUS: 0.9 mmol/L (ref 0.5–1.9)

## 2018-12-18 LAB — PHOSPHORUS: Phosphorus: 2.3 mg/dL — ABNORMAL LOW (ref 2.5–4.6)

## 2018-12-18 MED ORDER — SODIUM CHLORIDE 0.9 % IV SOLN
100.0000 mg | Freq: Two times a day (BID) | INTRAVENOUS | Status: DC
  Administered 2018-12-18 (×2): 100 mg via INTRAVENOUS
  Filled 2018-12-18 (×6): qty 100

## 2018-12-18 MED ORDER — DIPHENHYDRAMINE HCL 25 MG PO CAPS
50.0000 mg | ORAL_CAPSULE | Freq: Four times a day (QID) | ORAL | Status: DC | PRN
  Administered 2018-12-18: 50 mg via ORAL
  Filled 2018-12-18: qty 2

## 2018-12-18 MED ORDER — POTASSIUM CHLORIDE IN NACL 40-0.9 MEQ/L-% IV SOLN
INTRAVENOUS | Status: AC
  Administered 2018-12-18: 333 mL/h via INTRAVENOUS
  Filled 2018-12-18: qty 1000

## 2018-12-18 MED ORDER — ONDANSETRON HCL 4 MG PO TABS: 4.0000 mg | ORAL_TABLET | Freq: Four times a day (QID) | ORAL | Status: DC | PRN

## 2018-12-18 MED ORDER — SODIUM CHLORIDE 0.9 % IV BOLUS
1000.0000 mL | Freq: Once | INTRAVENOUS | Status: AC
  Administered 2018-12-18: 1000 mL via INTRAVENOUS

## 2018-12-18 MED ORDER — ACETAMINOPHEN 325 MG PO TABS
650.0000 mg | ORAL_TABLET | Freq: Four times a day (QID) | ORAL | Status: DC | PRN
  Administered 2018-12-18: 650 mg via ORAL
  Filled 2018-12-18: qty 2

## 2018-12-18 MED ORDER — HEPARIN SODIUM (PORCINE) 5000 UNIT/ML IJ SOLN
5000.0000 [IU] | Freq: Three times a day (TID) | INTRAMUSCULAR | Status: DC
  Administered 2018-12-18 – 2018-12-19 (×3): 5000 [IU] via SUBCUTANEOUS
  Filled 2018-12-18 (×3): qty 1

## 2018-12-18 MED ORDER — POTASSIUM CHLORIDE IN NACL 20-0.9 MEQ/L-% IV SOLN
INTRAVENOUS | Status: DC
  Administered 2018-12-18 (×2): via INTRAVENOUS

## 2018-12-18 MED ORDER — ONDANSETRON HCL 4 MG/2ML IJ SOLN: 4.0000 mg | Freq: Four times a day (QID) | INTRAMUSCULAR | Status: DC | PRN

## 2018-12-18 MED ORDER — KETOROLAC TROMETHAMINE 30 MG/ML IJ SOLN
30.0000 mg | Freq: Once | INTRAMUSCULAR | Status: AC
  Administered 2018-12-18: 30 mg via INTRAVENOUS
  Filled 2018-12-18: qty 1

## 2018-12-18 MED ORDER — KETOROLAC TROMETHAMINE 30 MG/ML IJ SOLN
30.0000 mg | Freq: Four times a day (QID) | INTRAMUSCULAR | Status: DC | PRN
  Administered 2018-12-18: 30 mg via INTRAVENOUS
  Filled 2018-12-18: qty 1

## 2018-12-18 MED ORDER — ACETAMINOPHEN 650 MG RE SUPP: 650.0000 mg | Freq: Four times a day (QID) | RECTAL | Status: DC | PRN

## 2018-12-18 NOTE — Progress Notes (Signed)
Patient admitted to the hospital earlier this morning by Dr. Robb Matar.  Patient seen and examined.  Continues to complain of erythematous rash over her chest, abdomen arms and legs.  It does not appear to involve her back or her legs below her knees.  Overall, she feels that her rash has mildly improved.  This has been present for the past 2 weeks.  She took a course of prednisone without significant improvement.  More recently, she did develop fevers.  Blood cultures have been sent.  She has not had any tick bites.  She is not had any new medications, new laundry detergents, clothing or soap.  She has been started on doxycycline.  [facility] spotted fever and Lyme titers have been sent.  We will also add HIV, RPR as well as EBV.  If there are no signs of significant infection, patient can likely discharge and follow-up with dermatology as an outpatient.  [employer]

## 2018-12-18 NOTE — H&P (Signed)
History and Physical    Veronica Nguyen KMQ:[id number] DOB: [birth year] DOA: 12/17/2018  PCP: Genevie Cheshire, MD   Patient coming from: Home.  I have personally briefly reviewed patient's old medical records in [facility]  Chief Complaint: Fever and rash.  HPI: Veronica Nguyen is a 48 y.o. female with medical history significant of fibromyalgia, lupus, rheumatoid arthritis who is coming to the emergency department due to a generalized burning rash since 12/05/2018.  She saw her doctor on 12/07/2018 who prescribed her prednisone after giving her an injection of Solu-Medrol and Benadryl.  She was also prescribed 2 alprazolam's for the day.  The patient states she has taken the meds and the rash is still present.  The rash has been centrally for well.  Today, she decided to come to the emergency department since she developed 31 F fever associated with sore throat, headache, arthralgias and myalgias at home.  She states that she took some acetaminophen prior to coming to the emergency department.  She denies travel history or sick contacts.  She denies rhinorrhea, dry or productive cough, dyspnea, wheezing, chest pain, palpitations, dizziness, diaphoresis, PND, orthopnea or pitting edema of the lower extremities.  She denies abdominal pain, nausea, emesis, diarrhea, constipation, melena or hematochezia.  No dysuria, frequency or hematuria.  Denies polyuria, polydipsia, polyphagia or blurred vision.  ED Course: Initial vital signs temperature 101.2 F, pulse 128, respirations 20, blood pressure 199/98 mmHg and O2 sat 98% on room air.  She was given a 1000 mL bolus and 800 mg of ibuprofen in the emergency department.  Cultures x2 were drawn.    Her work-up shows negative influenza a and B by PCR.  Negative group a strep by PCR.  CBC shows a white count of 20.3 with 89% neutrophils, hemoglobin 13.6 g/dL and platelets 165.  Lactic acid was normal at 0.9 mmol/L.  CMP shows a sodium 130, potassium  3.1 and CO2 of 20 mmol/L.  Glucose 136 and calcium 8.3 mg/dL.  Renal and hepatic function normal.  Chest radiograph does not show any acute cardiopulmonary pathology.  Review of Systems: As per HPI otherwise 10 point review of systems negative.   Past Medical History:  Diagnosis Date  . Fibromyalgia   . Lupus (HCC)   . Rheumatoid aortitis     Past Surgical History:  Procedure Laterality Date  . ABLATION    . ACHILLES TENDON REPAIR    . NECK SURGERY    . TUBAL LIGATION       reports that she has never smoked. She has never used smokeless tobacco. She reports that she does not drink alcohol or use drugs.  No Known Allergies   Prior to Admission medications   Medication Sig Start Date End Date Taking? Authorizing Provider  gabapentin (NEURONTIN) 300 MG capsule Take 300 mg by mouth 3 (three) times daily. 06/08/14  Yes [provider]  hydroxychloroquine (PLAQUENIL) 200 MG tablet Take 200 mg by mouth 2 (two) times daily. 09/02/14  Yes [provider]  lisinopril (PRINIVIL,ZESTRIL) 20 MG tablet Take 20 mg by mouth daily. 06/08/14  Yes [provider]  amoxicillin-clavulanate (AUGMENTIN) 875-125 MG tablet Take 1 tablet by mouth every 12 (twelve) hours. 11/29/15   Hayden Rasmussen, NP  cephALEXin (KEFLEX) 500 MG capsule Take 1 capsule (500 mg total) by mouth 4 (four) times daily. 11/24/15   Burgess Amor, PA-C  clindamycin (CLEOCIN) 300 MG capsule Take 1 capsule (300 mg total) by mouth 4 (four) times  daily. 11/27/16   Triplett, Tammy, PA-C  famotidine (PEPCID) 20 MG tablet Take 1 tablet (20 mg total) by mouth 2 (two) times daily. 12/23/17   Vanetta Mulders, MD  HYDROcodone-acetaminophen (NORCO/VICODIN) 5-325 MG tablet Take one-two tabs po q 4-6 hrs prn pain 11/27/16   Triplett, Tammy, PA-C  methocarbamol (ROBAXIN) 500 MG tablet Take 1 tablet (500 mg total) by mouth every 8 (eight) hours as needed for muscle spasms. 11/27/18   Rancour, Jeannett Senior, MD  methylPREDNISolone (MEDROL  DOSEPAK) 4 MG TBPK tablet As directed 11/27/18   Rancour, Jeannett Senior, MD  promethazine (PHENERGAN) 25 MG tablet Take 1 tablet (25 mg total) by mouth every 6 (six) hours as needed. 12/23/17   Vanetta Mulders, MD  traMADol (ULTRAM) 50 MG tablet Take 1 tablet (50 mg total) by mouth every 6 (six) hours as needed. 11/24/15   Burgess Amor, PA-C  traZODone (DESYREL) 100 MG tablet Take 100 mg by mouth at bedtime. 06/08/14   [provider]    Physical Exam: Vitals:   12/17/18 2232 12/18/18 0012 12/18/18 0134 12/18/18 0211  BP: (!) 167/78  (!) 129/59 133/71  Pulse: (!) 133  (!) 113 (!) 106  Resp: 18   16  Temp: 100.3 F (37.9 C) (!) 100.5 F (38.1 C)  98.4 F (36.9 C)  TempSrc: Oral Oral  Oral  SpO2: 98%  97% 98%  Weight:      Height:        Constitutional: NAD, calm, comfortable Eyes: PERRL, lids and conjunctivae normal ENMT: Mucous membranes are moist. Posterior pharynx clear of any exudate or lesions.Normal dentition.  Neck: normal, supple, no masses, no thyromegaly Respiratory: clear to auscultation bilaterally, no wheezing, no crackles. Normal respiratory effort. No accessory muscle use.  Cardiovascular: Regular rate and rhythm, no murmurs / rubs / gallops. No extremity edema. 2+ pedal pulses. No carotid bruits.  Abdomen: no tenderness, no masses palpated. No hepatosplenomegaly. Bowel sounds positive.  Musculoskeletal: no clubbing / cyanosis. No joint deformity upper and lower extremities. Good ROM, no contractures. Normal muscle tone.  Skin: Positive blanching rash on all extremities.  Minimal on the trunk. Neurologic: CN 2-12 grossly intact. Sensation intact, DTR normal. Strength 5/5 in all 4.  Psychiatric: Normal judgment and insight. Alert and oriented x 3. Normal mood.   Labs on Admission: I have personally reviewed following labs and imaging studies  CBC: Recent Labs  Lab 12/17/18 2258  WBC 20.3*  NEUTROABS 17.9*  HGB 13.6  HCT 41.0  MCV 87.4  PLT 345   Basic  Metabolic Panel: Recent Labs  Lab 12/17/18 2258 12/18/18 0028  NA 130*  --   K 3.1*  --   CL 101  --   CO2 20*  --   GLUCOSE 136*  --   BUN 15  --   CREATININE 0.70  --   CALCIUM 8.3*  --   MG  --  1.7  PHOS  --  2.3*   GFR: Estimated Creatinine Clearance: 101.5 mL/min (by C-G formula based on SCr of 0.7 mg/dL). Liver Function Tests: Recent Labs  Lab 12/17/18 2258  AST 16  ALT 16  ALKPHOS 65  BILITOT 0.7  PROT 7.3  ALBUMIN 3.8   No results for input(s): LIPASE, AMYLASE in the last 168 hours. No results for input(s): AMMONIA in the last 168 hours. Coagulation Profile: No results for input(s): INR, PROTIME in the last 168 hours. Cardiac Enzymes: No results for input(s): CKTOTAL, CKMB, CKMBINDEX, TROPONINI in the last 168  hours. BNP (last 3 results) No results for input(s): PROBNP in the last 8760 hours. HbA1C: No results for input(s): HGBA1C in the last 72 hours. CBG: No results for input(s): GLUCAP in the last 168 hours. Lipid Profile: No results for input(s): CHOL, HDL, LDLCALC, TRIG, CHOLHDL, LDLDIRECT in the last 72 hours. Thyroid Function Tests: No results for input(s): TSH, T4TOTAL, FREET4, T3FREE, THYROIDAB in the last 72 hours. Anemia Panel: No results for input(s): VITAMINB12, FOLATE, FERRITIN, TIBC, IRON, RETICCTPCT in the last 72 hours. Urine analysis:    Component Value Date/Time   COLORURINE STRAW (A) 11/27/2018 0413   APPEARANCEUR CLEAR 11/27/2018 0413   APPEARANCEUR Clear 09/02/2013 1424   LABSPEC >1.046 (H) 11/27/2018 0413   LABSPEC 1.018 09/02/2013 1424   PHURINE 7.0 11/27/2018 0413   GLUCOSEU NEGATIVE 11/27/2018 0413   GLUCOSEU Negative 09/02/2013 1424   HGBUR SMALL (A) 11/27/2018 0413   BILIRUBINUR NEGATIVE 11/27/2018 0413   BILIRUBINUR Negative 09/02/2013 1424   KETONESUR NEGATIVE 11/27/2018 0413   PROTEINUR NEGATIVE 11/27/2018 0413   NITRITE NEGATIVE 11/27/2018 0413   LEUKOCYTESUR NEGATIVE 11/27/2018 0413   LEUKOCYTESUR Negative  09/02/2013 1424    Radiological Exams on Admission: Dg Chest 2 View  Result Date: 12/18/2018 CLINICAL DATA:  Initial evaluation for acute fever. EXAM: CHEST - 2 VIEW COMPARISON:  Prior CT from 11/27/2018. FINDINGS: The cardiac and mediastinal silhouettes are stable in size and contour, and remain within normal limits. The lungs are normally inflated. No airspace consolidation, pleural effusion, or pulmonary edema is identified. There is no pneumothorax. No acute osseous abnormality identified.  Cervical ACDF noted. IMPRESSION: No radiographic evidence for active cardiopulmonary disease. Electronically Signed   By: Rise MuBenjamin  McClintock M.D.   On: 12/18/2018 01:20    EKG: Independently reviewed.    Assessment/Plan Principal Problem:   Viral syndrome Sudden onset of fever, drug use and myalgias.  She has had a centrifugal rash for the past 2 weeks treated with prednisone taper/Benadryl.  There is no travel history.  There is no sick contacts.  So far work-up has been benign.  RMSF and Borrelia Burgdorfi titles have been sent.  Continue IV fluids and symptoms treatment.  I will start the patient on doxycycline.  Consider autoimmune fever, following prednisone taper?  Active Problems:   Lupus (HCC) Continue hydroxychloroquine.    Rheumatoid aortitis Anti-inflammatories as needed.    Hypokalemia Replacing. Magnesium was supplemented.    DVT prophylaxis: Heparin SQ. Code Status: Full code Family Communication: Disposition Plan: Observation for further work-up and treatment Consults called:  Admission status: Observation/telemetry   Bobette Moavid Manuel Carlene Bickley MD [facility]  12/18/2018, 4:41 AM

## 2018-12-19 DIAGNOSIS — E876 Hypokalemia: Secondary | ICD-10-CM

## 2018-12-19 DIAGNOSIS — R21 Rash and other nonspecific skin eruption: Secondary | ICD-10-CM | POA: Diagnosis not present

## 2018-12-19 DIAGNOSIS — M329 Systemic lupus erythematosus, unspecified: Secondary | ICD-10-CM | POA: Diagnosis not present

## 2018-12-19 DIAGNOSIS — I471 Supraventricular tachycardia: Secondary | ICD-10-CM

## 2018-12-19 LAB — CBC WITH DIFFERENTIAL/PLATELET
Abs Immature Granulocytes: 0.05 10*3/uL (ref 0.00–0.07)
BASOS ABS: 0 10*3/uL (ref 0.0–0.1)
BASOS PCT: 0 %
Eosinophils Absolute: 0.1 10*3/uL (ref 0.0–0.5)
Eosinophils Relative: 1 %
HCT: 38.2 % (ref 36.0–46.0)
Hemoglobin: 12 g/dL (ref 12.0–15.0)
Immature Granulocytes: 1 %
Lymphocytes Relative: 19 %
Lymphs Abs: 2 10*3/uL (ref 0.7–4.0)
MCH: 29.2 pg (ref 26.0–34.0)
MCHC: 31.4 g/dL (ref 30.0–36.0)
MCV: 92.9 fL (ref 80.0–100.0)
Monocytes Absolute: 0.5 10*3/uL (ref 0.1–1.0)
Monocytes Relative: 5 %
NRBC: 0 % (ref 0.0–0.2)
Neutro Abs: 7.8 10*3/uL — ABNORMAL HIGH (ref 1.7–7.7)
Neutrophils Relative %: 74 %
Platelets: 294 10*3/uL (ref 150–400)
RBC: 4.11 MIL/uL (ref 3.87–5.11)
RDW: 13.5 % (ref 11.5–15.5)
WBC: 10.5 10*3/uL (ref 4.0–10.5)

## 2018-12-19 LAB — COMPREHENSIVE METABOLIC PANEL
ALT: 16 U/L (ref 0–44)
AST: 16 U/L (ref 15–41)
Albumin: 3.1 g/dL — ABNORMAL LOW (ref 3.5–5.0)
Alkaline Phosphatase: 59 U/L (ref 38–126)
Anion gap: 4 — ABNORMAL LOW (ref 5–15)
BUN: 7 mg/dL (ref 6–20)
CALCIUM: 8.5 mg/dL — AB (ref 8.9–10.3)
CO2: 21 mmol/L — ABNORMAL LOW (ref 22–32)
Chloride: 114 mmol/L — ABNORMAL HIGH (ref 98–111)
Creatinine, Ser: 0.49 mg/dL (ref 0.44–1.00)
GFR calc Af Amer: >60 mL/min (ref >60)
Glucose, Bld: 115 mg/dL — ABNORMAL HIGH (ref 70–99)
Potassium: 3.8 mmol/L (ref 3.5–5.1)
Sodium: 139 mmol/L (ref 135–145)
Total Bilirubin: 0.3 mg/dL (ref 0.3–1.2)
Total Protein: 6.2 g/dL — ABNORMAL LOW (ref 6.5–8.1)

## 2018-12-19 LAB — RESPIRATORY PANEL BY PCR

## 2018-12-19 LAB — EBV AB TO VIRAL CAPSID AG PNL, IGG+IGM
EBV VCA IgG: <18 U/mL (ref 0.0–17.9)
EBV VCA IgM: <36 U/mL (ref 0.0–35.9)

## 2018-12-19 LAB — B. BURGDORFI ANTIBODIES: B burgdorferi Ab IgG+IgM: <0.91 {ISR} (ref 0.00–0.90)

## 2018-12-19 LAB — ROCKY MTN SPOTTED FVR ABS PNL(IGG+IGM)
RMSF IgG: NEGATIVE
RMSF IgM: 0.3 index (ref 0.00–0.89)

## 2018-12-19 LAB — VITAMIN D 25 HYDROXY (VIT D DEFICIENCY, FRACTURES): Vit D, 25-Hydroxy: 9.2 ng/mL — ABNORMAL LOW (ref 30.0–100.0)

## 2018-12-19 LAB — RPR: RPR Ser Ql: NONREACTIVE

## 2018-12-19 LAB — HIV ANTIBODY (ROUTINE TESTING W REFLEX): HIV SCREEN 4TH GENERATION: NONREACTIVE

## 2018-12-19 MED ORDER — METOPROLOL TARTRATE 25 MG PO TABS
25.0000 mg | ORAL_TABLET | Freq: Two times a day (BID) | ORAL | Status: DC
  Administered 2018-12-19: 25 mg via ORAL
  Filled 2018-12-19: qty 1

## 2018-12-19 MED ORDER — METOPROLOL TARTRATE 25 MG PO TABS: 25.0000 mg | ORAL_TABLET | Freq: Two times a day (BID) | ORAL | 0 refills | Status: AC

## 2018-12-19 MED ORDER — DIPHENHYDRAMINE HCL 25 MG PO CAPS: 25.0000 mg | ORAL_CAPSULE | Freq: Four times a day (QID) | ORAL | 0 refills | Status: DC | PRN

## 2018-12-19 MED ORDER — DOXYCYCLINE HYCLATE 100 MG PO CAPS: 100.0000 mg | ORAL_CAPSULE | Freq: Two times a day (BID) | ORAL | 0 refills | Status: AC

## 2018-12-19 NOTE — Discharge Summary (Signed)
Physician Discharge Summary  Veronica LeavenKatina S Cisar ZOX:[id number] DOB: 09/25/1971 DOA: 12/17/2018  PCP: Genevie CheshireMcAdams, Holman Page, MD  Admit date: 12/17/2018 Discharge date: 12/19/2018  Admitted From: Home Disposition: Home  Recommendations for Outpatient Follow-up:  1. Follow up with PCP in 1-2 weeks 2. Overall rash is improving.  If recurs or worsens, consider dermatology referral.  Discharge Condition: Stable CODE STATUS: Full code Diet recommendation: Heart healthy  Brief/Interim Summary: 48 year old female with a history of fibromyalgia, lupus, rheumatoid arthritis, admitted to the hospital with fever and diffuse rash over her chest, abdomen, legs and arms.  She reports that fever had been present for approximately 2 weeks.  She has seen her primary care physician and was prescribed a course of prednisone which did not improve her symptoms.  More recently, she developed a fever.  Labs are relatively unrevealing.  Rapid strep test was negative.  Respiratory viral panel also negative.  HIV also negative.  She did not describe any insect bites that she is aware of.  [facility] and Lyme disease were sent and are in process.  She was empirically started on doxycycline.  EBV titers were also sent.  The patient was monitored in the hospital and overall rashes started to improve.  She denies any environmental triggers such as new soaps/detergents/Linen/clothing.  Fevers have resolved.  Blood cultures are showing no growth.  She is feeling significantly better and wants to discharge home.  She will be prescribed a course of doxycycline.  She is to follow-up with her primary care physician in the next week.  If rash recurs, should consider referral to dermatologist.  She did have episode of SVT noted on telemetry.  She reports frequently having palpitations and has attributed this to anxiety.  She will prescribe low-dose beta-blockers.  Further work-up including echocardiogram can be performed  as outpatient per primary care physician.  Discharge Diagnoses:  Principal Problem:   Rash Active Problems:   Lupus (HCC)   Rheumatoid aortitis   Hypokalemia   Hypocalcemia   Hypophosphatemia   Paroxysmal SVT (supraventricular tachycardia) (HCC)    Discharge Instructions  Discharge Instructions    Diet - low sodium heart healthy   Complete by:  As directed    Increase activity slowly   Complete by:  As directed      Allergies as of 12/19/2018   No Known Allergies     Medication List    STOP taking these medications   amoxicillin-clavulanate 875-125 MG tablet Commonly known as:  AUGMENTIN   cephALEXin 500 MG capsule Commonly known as:  KEFLEX   clindamycin 300 MG capsule Commonly known as:  CLEOCIN   famotidine 20 MG tablet Commonly known as:  PEPCID   HYDROcodone-acetaminophen 5-325 MG tablet Commonly known as:  NORCO/VICODIN   methocarbamol 500 MG tablet Commonly known as:  ROBAXIN   methylPREDNISolone 4 MG Tbpk tablet Commonly known as:  MEDROL DOSEPAK   promethazine 25 MG tablet Commonly known as:  PHENERGAN   traMADol 50 MG tablet Commonly known as:  ULTRAM   traZODone 100 MG tablet Commonly known as:  DESYREL     TAKE these medications   diphenhydrAMINE 25 mg capsule Commonly known as:  BENADRYL Take 1 capsule (25 mg total) by mouth every 6 (six) hours as needed.   doxycycline 100 MG capsule Commonly known as:  VIBRAMYCIN Take 1 capsule (100 mg total) by mouth 2 (two) times daily.   gabapentin 300 MG capsule Commonly known as:  NEURONTIN  Take 300 mg by mouth 3 (three) times daily.   hydroxychloroquine 200 MG tablet Commonly known as:  PLAQUENIL Take 200 mg by mouth 2 (two) times daily.   lisinopril 20 MG tablet Commonly known as:  PRINIVIL,ZESTRIL Take 20 mg by mouth daily.   metoprolol tartrate 25 MG tablet Commonly known as:  LOPRESSOR Take 1 tablet (25 mg total) by mouth 2 (two) times daily.       No Known  Allergies  Consultations:     Procedures/Studies: Dg Chest 2 View  Result Date: 12/18/2018 CLINICAL DATA:  Initial evaluation for acute fever. EXAM: CHEST - 2 VIEW COMPARISON:  Prior CT from 11/27/2018. FINDINGS: The cardiac and mediastinal silhouettes are stable in size and contour, and remain within normal limits. The lungs are normally inflated. No airspace consolidation, pleural effusion, or pulmonary edema is identified. There is no pneumothorax. No acute osseous abnormality identified.  Cervical ACDF noted. IMPRESSION: No radiographic evidence for active cardiopulmonary disease. Electronically Signed   By: Rise Mu M.D.   On: 12/18/2018 01:20   Ct Angio Chest/abd/pel For Dissection W And/or Wo Contrast  Result Date: 11/27/2018 CLINICAL DATA:  Mid and lower back pain with pain going down to the leg and numbness in the great toe. No injury. EXAM: CT ANGIOGRAPHY CHEST, ABDOMEN AND PELVIS TECHNIQUE: Multidetector CT imaging through the chest, abdomen and pelvis was performed using the standard protocol during bolus administration of intravenous contrast. Multiplanar reconstructed images and MIPs were obtained and reviewed to evaluate the vascular anatomy. CONTRAST:  ISOVUE-370 IOPAMIDOL (ISOVUE-370) INJECTION 76% COMPARISON:  None. FINDINGS: CTA CHEST FINDINGS Cardiovascular: Noncontrast CT images of the chest demonstrate normal caliber thoracic aorta. No significant calcification. No intramural hematoma. Images obtained during arterial phase after contrast administration demonstrate normal caliber thoracic aorta. No aneurysm or dissection. Great vessel origins are patent. Good opacification of the central and segmental pulmonary arteries. No focal filling defects. No evidence of significant pulmonary embolus. Normal heart size. No pericardial effusions. Mediastinum/Nodes: No significant lymphadenopathy. Esophagus is decompressed. Lungs/Pleura: Linear scarring or atelectasis in the  lung bases. No airspace disease or consolidation in the lungs. No pleural effusions. No pneumothorax. Airways are patent. Musculoskeletal: Postoperative changes in the lower cervical spine. Mild degenerative changes in the thoracic spine. No vertebral compression deformities. No depressed sternal or rib fractures. Review of the MIP images confirms the above findings. CTA ABDOMEN AND PELVIS FINDINGS VASCULAR Aorta: Normal caliber aorta without aneurysm, dissection, vasculitis or significant stenosis. Celiac: Patent without evidence of aneurysm, dissection, vasculitis or significant stenosis. SMA: Patent without evidence of aneurysm, dissection, vasculitis or significant stenosis. Renals: Both renal arteries are patent without evidence of aneurysm, dissection, vasculitis, fibromuscular dysplasia or significant stenosis. IMA: Patent without evidence of aneurysm, dissection, vasculitis or significant stenosis. Inflow: Patent without evidence of aneurysm, dissection, vasculitis or significant stenosis. Veins: No obvious venous abnormality within the limitations of this arterial phase study. Review of the MIP images confirms the above findings. NON-VASCULAR Hepatobiliary: Mild diffuse fatty infiltration of the liver. No focal liver abnormality is seen. No gallstones, gallbladder wall thickening, or biliary dilatation. Pancreas: Unremarkable. No pancreatic ductal dilatation or surrounding inflammatory changes. Spleen: Normal in size without focal abnormality. Adrenals/Urinary Tract: Adrenal glands are unremarkable. Kidneys are normal, without renal calculi, focal lesion, or hydronephrosis. Bladder is unremarkable. Stomach/Bowel: Stomach is within normal limits. Appendix appears normal. No evidence of bowel wall thickening, distention, or inflammatory changes. Lymphatic: No significant lymphadenopathy. Reproductive: Uterus and bilateral adnexa are unremarkable. Surgical clips  consistent with tubal ligations. Other: No  abdominal wall hernia or abnormality. No abdominopelvic ascites. Musculoskeletal: No acute or significant osseous findings. Review of the MIP images confirms the above findings. IMPRESSION: No evidence of aneurysm or dissection in the thoracic or abdominal aorta. No significant pulmonary embolus. No evidence of active pulmonary disease. No acute process demonstrated in the abdomen or pelvis. Diffuse fatty infiltration of the liver. Electronically Signed   By: Burman Nieves M.D.   On: 11/27/2018 03:58      Subjective: Feels that rash is improving.  No chest pain or shortness of breath.  Feeling better and wants to go home.  Discharge Exam: Vitals:   12/19/18 0535 12/19/18 1418 12/19/18 1546 12/19/18 1622  BP: (!) 164/73 (!) 157/89 (!) 159/89 (!) 160/95  Pulse: 98 91 84 87  Resp: 16 16 16    Temp: 98.5 F (36.9 C) 98.5 F (36.9 C)    TempSrc: Oral Oral    SpO2: 98% 98% 100%   Weight:      Height:        General: Pt is alert, awake, not in acute distress Cardiovascular: RRR, S1/S2 +, no rubs, no gallops Respiratory: CTA bilaterally, no wheezing, no rhonchi Abdominal: Soft, NT, ND, bowel sounds + Extremities: no edema, no cyanosis Skin: Overall rash is improving    The results of significant diagnostics from this hospitalization (including imaging, microbiology, ancillary and laboratory) are listed below for reference.     Microbiology: Recent Results (from the past 240 hour(s))  Respiratory Panel by PCR     Status: None   Collection Time: 12/17/18  9:15 PM  Result Value Ref Range Status   Adenovirus NOT DETECTED NOT DETECTED Final   Coronavirus 229E NOT DETECTED NOT DETECTED Final    Comment: (NOTE) The Coronavirus on the Respiratory Panel, DOES NOT test for the novel  Coronavirus (2019 nCoV)    Coronavirus HKU1 NOT DETECTED NOT DETECTED Final   Coronavirus NL63 NOT DETECTED NOT DETECTED Final   Coronavirus OC43 NOT DETECTED NOT DETECTED Final   Metapneumovirus NOT  DETECTED NOT DETECTED Final   Rhinovirus / Enterovirus NOT DETECTED NOT DETECTED Final   Influenza A NOT DETECTED NOT DETECTED Final   Influenza B NOT DETECTED NOT DETECTED Final   Parainfluenza Virus 1 NOT DETECTED NOT DETECTED Final   Parainfluenza Virus 2 NOT DETECTED NOT DETECTED Final   Parainfluenza Virus 3 NOT DETECTED NOT DETECTED Final   Parainfluenza Virus 4 NOT DETECTED NOT DETECTED Final   Respiratory Syncytial Virus NOT DETECTED NOT DETECTED Final   Bordetella pertussis NOT DETECTED NOT DETECTED Final   Chlamydophila pneumoniae NOT DETECTED NOT DETECTED Final   Mycoplasma pneumoniae NOT DETECTED NOT DETECTED Final    Comment: Performed at [facility], [address], [place], [place] [address]  Group A Strep by PCR     Status: None   Collection Time: 12/17/18  9:16 PM  Result Value Ref Range Status   Group A Strep by PCR NOT DETECTED NOT DETECTED Final    Comment: Performed at [facility], [address], [place], [place] [address]  Blood culture (routine x 2)     Status: None (Preliminary result)   Collection Time: 12/18/18 12:28 AM  Result Value Ref Range Status   Specimen Description BLOOD LEFT ANTECUBITAL  Final   Special Requests   Final    BOTTLES DRAWN AEROBIC AND ANAEROBIC Blood Culture results may not be optimal due to an excessive volume of blood  received in culture bottles   Culture   Final    NO GROWTH 1 DAY Performed at [facility], [address] [place], [place] [address]    Report Status PENDING  Incomplete  Blood culture (routine x 2)     Status: None (Preliminary result)   Collection Time: 12/18/18 12:30 AM  Result Value Ref Range Status   Specimen Description BLOOD RIGHT HAND  Final   Special Requests   Final    BOTTLES DRAWN AEROBIC AND ANAEROBIC Blood Culture adequate volume   Culture   Final    NO GROWTH 1 DAY Performed at [facility], [address] [place], [place] [address]    Report Status PENDING  Incomplete     Labs: BNP  (last 3 results) No results for input(s): BNP in the last 8760 hours. Basic Metabolic Panel: Recent Labs  Lab 12/17/18 2258 12/18/18 0028 12/18/18 0430 12/19/18 0421  NA 130*  --  140 139  K 3.1*  --  3.9 3.8  CL 101  --  115* 114*  CO2 20*  --  21* 21*  GLUCOSE 136*  --  85 115*  BUN 15  --  11 7  CREATININE 0.70  --  0.60 0.49  CALCIUM 8.3*  --  7.6* 8.5*  MG  --  1.7  --   --   PHOS  --  2.3*  --   --    Liver Function Tests: Recent Labs  Lab 12/17/18 2258 12/19/18 0421  AST 16 16  ALT 16 16  ALKPHOS 65 59  BILITOT 0.7 0.3  PROT 7.3 6.2*  ALBUMIN 3.8 3.1*   No results for input(s): LIPASE, AMYLASE in the last 168 hours. No results for input(s): AMMONIA in the last 168 hours. CBC: Recent Labs  Lab 12/17/18 2258 12/18/18 0430 12/19/18 0421  WBC 20.3* 13.9* 10.5  NEUTROABS 17.9* 10.9* 7.8*  HGB 13.6 11.6* 12.0  HCT 41.0 36.0 38.2  MCV 87.4 91.1 92.9  PLT 345 278 294   Cardiac Enzymes: No results for input(s): CKTOTAL, CKMB, CKMBINDEX, TROPONINI in the last 168 hours. BNP: Invalid input(s): POCBNP CBG: No results for input(s): GLUCAP in the last 168 hours. D-Dimer No results for input(s): DDIMER in the last 72 hours. Hgb A1c No results for input(s): HGBA1C in the last 72 hours. Lipid Profile No results for input(s): CHOL, HDL, LDLCALC, TRIG, CHOLHDL, LDLDIRECT in the last 72 hours. Thyroid function studies No results for input(s): TSH, T4TOTAL, T3FREE, THYROIDAB in the last 72 hours.  Invalid input(s): FREET3 Anemia work up No results for input(s): VITAMINB12, FOLATE, FERRITIN, TIBC, IRON, RETICCTPCT in the last 72 hours. Urinalysis    Component Value Date/Time   COLORURINE STRAW (A) 11/27/2018 0413   APPEARANCEUR CLEAR 11/27/2018 0413   APPEARANCEUR Clear 09/02/2013 1424   LABSPEC >1.046 (H) 11/27/2018 0413   LABSPEC 1.018 09/02/2013 1424   PHURINE 7.0 11/27/2018 0413   GLUCOSEU NEGATIVE 11/27/2018 0413   GLUCOSEU Negative 09/02/2013 1424    HGBUR SMALL (A) 11/27/2018 0413   BILIRUBINUR NEGATIVE 11/27/2018 0413   BILIRUBINUR Negative 09/02/2013 1424   KETONESUR NEGATIVE 11/27/2018 0413   PROTEINUR NEGATIVE 11/27/2018 0413   NITRITE NEGATIVE 11/27/2018 0413   LEUKOCYTESUR NEGATIVE 11/27/2018 0413   LEUKOCYTESUR Negative 09/02/2013 1424   Sepsis Labs Invalid input(s): PROCALCITONIN,  WBC,  LACTICIDVEN Microbiology Recent Results (from the past 240 hour(s))  Respiratory Panel by PCR     Status: None   Collection Time: 12/17/18  9:15 PM  Result Value Ref Range Status   Adenovirus NOT DETECTED NOT DETECTED Final   Coronavirus 229E NOT DETECTED NOT DETECTED Final    Comment: (NOTE) The Coronavirus on the Respiratory Panel, DOES NOT test for the novel  Coronavirus (2019 nCoV)    Coronavirus HKU1 NOT DETECTED NOT DETECTED Final   Coronavirus NL63 NOT DETECTED NOT DETECTED Final   Coronavirus OC43 NOT DETECTED NOT DETECTED Final   Metapneumovirus NOT DETECTED NOT DETECTED Final   Rhinovirus / Enterovirus NOT DETECTED NOT DETECTED Final   Influenza A NOT DETECTED NOT DETECTED Final   Influenza B NOT DETECTED NOT DETECTED Final   Parainfluenza Virus 1 NOT DETECTED NOT DETECTED Final   Parainfluenza Virus 2 NOT DETECTED NOT DETECTED Final   Parainfluenza Virus 3 NOT DETECTED NOT DETECTED Final   Parainfluenza Virus 4 NOT DETECTED NOT DETECTED Final   Respiratory Syncytial Virus NOT DETECTED NOT DETECTED Final   Bordetella pertussis NOT DETECTED NOT DETECTED Final   Chlamydophila pneumoniae NOT DETECTED NOT DETECTED Final   Mycoplasma pneumoniae NOT DETECTED NOT DETECTED Final    Comment: Performed at [facility], [address], [place], [place] [address]  Group A Strep by PCR     Status: None   Collection Time: 12/17/18  9:16 PM  Result Value Ref Range Status   Group A Strep by PCR NOT DETECTED NOT DETECTED Final    Comment: Performed at [facility], [address] [place], [place] [address]  Blood culture  (routine x 2)     Status: None (Preliminary result)   Collection Time: 12/18/18 12:28 AM  Result Value Ref Range Status   Specimen Description BLOOD LEFT ANTECUBITAL  Final   Special Requests   Final    BOTTLES DRAWN AEROBIC AND ANAEROBIC Blood Culture results may not be optimal due to an excessive volume of blood received in culture bottles   Culture   Final    NO GROWTH 1 DAY Performed at [facility], [address], [place], [place] [address]    Report Status PENDING  Incomplete  Blood culture (routine x 2)     Status: None (Preliminary result)   Collection Time: 12/18/18 12:30 AM  Result Value Ref Range Status   Specimen Description BLOOD RIGHT HAND  Final   Special Requests   Final    BOTTLES DRAWN AEROBIC AND ANAEROBIC Blood Culture adequate volume   Culture   Final    NO GROWTH 1 DAY Performed at [facility], [address], [place], [place] [address]    Report Status PENDING  Incomplete     Time coordinating discharge:  SIGNED:   Erick Blinks, MD  [facility] 12/19/2018, 9:10 PM   If 7PM-7AM, please contact night-coverage [email or web address]

## 2018-12-19 NOTE — Progress Notes (Signed)
Patient discharged home with instructions given on medications and follow up visits,patient  verbalized understanding. Prescriptions sent to Pharmacy of choice documented on AVS. Accompanied by staff to an awaiting vehicle. 

## 2018-12-20 LAB — HIV ANTIBODY (ROUTINE TESTING W REFLEX): HIV Screen 4th Generation wRfx: NONREACTIVE

## 2018-12-23 LAB — CULTURE, BLOOD (ROUTINE X 2)
Culture: NO GROWTH
Culture: NO GROWTH
Special Requests: ADEQUATE

## 2019-03-22 ENCOUNTER — Other Ambulatory Visit: Payer: Self-pay

## 2019-03-22 ENCOUNTER — Emergency Department (HOSPITAL_COMMUNITY)
Admission: EM | Admit: 2019-03-22 | Discharge: 2019-03-23 | Disposition: A | Discharge status: Home / Self Care | Payer: 59 | Attending: Emergency Medicine | Admitting: Emergency Medicine

## 2019-03-22 ENCOUNTER — Encounter (HOSPITAL_COMMUNITY): Payer: Self-pay | Admitting: Emergency Medicine

## 2019-03-22 DIAGNOSIS — Z79899 Other long term (current) drug therapy: Secondary | ICD-10-CM | POA: Insufficient documentation

## 2019-03-22 DIAGNOSIS — M069 Rheumatoid arthritis, unspecified: Secondary | ICD-10-CM | POA: Diagnosis not present

## 2019-03-22 DIAGNOSIS — M2550 Pain in unspecified joint: Secondary | ICD-10-CM | POA: Insufficient documentation

## 2019-03-22 MED ORDER — DEXAMETHASONE 4 MG PO TABS: 4.0000 mg | ORAL_TABLET | Freq: Two times a day (BID) | ORAL | 0 refills | Status: AC

## 2019-03-22 MED ORDER — DEXAMETHASONE SODIUM PHOSPHATE 10 MG/ML IJ SOLN
10.0000 mg | Freq: Once | INTRAMUSCULAR | Status: AC
  Administered 2019-03-22: 23:00:00 10 mg via INTRAMUSCULAR
  Filled 2019-03-22: qty 1

## 2019-03-22 MED ORDER — HYDROCODONE-ACETAMINOPHEN 5-325 MG PO TABS: 1.0000 | ORAL_TABLET | ORAL | 0 refills | Status: AC | PRN

## 2019-03-22 MED ORDER — PROMETHAZINE HCL 12.5 MG PO TABS
12.5000 mg | ORAL_TABLET | Freq: Once | ORAL | Status: AC
  Administered 2019-03-22: 23:00:00 12.5 mg via ORAL
  Filled 2019-03-22: qty 1

## 2019-03-22 MED ORDER — MORPHINE SULFATE (PF) 10 MG/ML IV SOLN
10.0000 mg | Freq: Once | INTRAVENOUS | Status: AC
  Administered 2019-03-22: 23:00:00 10 mg via INTRAMUSCULAR
  Filled 2019-03-22: qty 1

## 2019-03-22 MED ORDER — KETOROLAC TROMETHAMINE 10 MG PO TABS
10.0000 mg | ORAL_TABLET | Freq: Once | ORAL | Status: AC
  Administered 2019-03-22: 23:00:00 10 mg via ORAL
  Filled 2019-03-22: qty 1

## 2019-03-22 NOTE — ED Provider Notes (Signed)
[facility] EMERGENCY DEPARTMENT Provider Note   CSN: [id number] Arrival date & time: 03/22/19  2216    History   Chief Complaint Chief Complaint  Patient presents with  . Joint Pain    HPI Veronica Nguyen is a 48 y.o. female.     Patient is a 48 year old female who presents to the emergency department with a complaint of multiple joint pain.  The patient states that she has had problems with her joints and a rash for over 4 months.  She is been seen by rheumatology and dermatology.  There is been question of stills disease, but the work-up is in progress.  It is also of note that patient has a family history of lupus, fibromyalgia, and rheumatoid arthritis.  The patient states that she is been told to use the ibuprofen for her joint pain.  And up to this point the joint pain was intermittently controlled with the ibuprofen.  In the last 2 to 3 days, the patient states the pain is gotten progressively worse.  She has not slept in 2 nights according to the patient.  She she has fevers, particularly during the middle or end of the day.  She says these have not been escalating any more than usual.  It is also of note that the patient has a generalized rash that is also been evaluated.  She is currently seeing dermatology for additional evaluation of this particular problem.  The history is provided by the patient.    Past Medical History:  Diagnosis Date  . Fibromyalgia   . Lupus (HCC)   . Rheumatoid aortitis     Patient Active Problem List   Diagnosis Date Noted  . Paroxysmal SVT (supraventricular tachycardia) (HCC) 12/19/2018  . Rash 12/19/2018  . Lupus (HCC) 12/18/2018  . 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 Hypokalemia 12/18/2018  . Hypocalcemia 12/18/2018  . Hypophosphatemia 12/18/2018  . Right wrist fracture 09/08/2014  . Distal radius fracture 07/28/2014    Past Surgical History:  Procedure Laterality Date  . ABLATION    . ACHILLES TENDON REPAIR    . NECK SURGERY     . TUBAL LIGATION       OB History   No obstetric history on file.      Home Medications    Prior to Admission medications   Medication Sig Start Date End Date Taking? Authorizing Provider  amitriptyline (ELAVIL) 100 MG tablet Take 100 mg by mouth at bedtime as needed for sleep.  12/30/18  Yes [provider]  Calcium Carb-Cholecalciferol 600-800 MG-UNIT CHEW Chew 1 tablet by mouth every morning.  09/07/14  Yes [provider]  colchicine 0.6 MG tablet Take 0.6 mg by mouth 2 (two) times a day. 03/12/19  Yes [provider]  folic acid (FOLVITE) 1 MG tablet Take 1 mg by mouth See admin instructions. Take on all days except Fridays. 03/10/19  Yes [provider]  gabapentin (NEURONTIN) 400 MG capsule Take 400 mg by mouth 3 (three) times daily. 03/10/19  Yes [provider]  ibuprofen (ADVIL) 200 MG tablet Take 800 mg by mouth every 6 (six) hours as needed for mild pain or moderate pain.    Yes [provider]  lisinopril (PRINIVIL,ZESTRIL) 20 MG tablet Take 20 mg by mouth daily. 06/08/14  Yes [provider]  methotrexate 2.5 MG tablet Take 15 mg by mouth every Friday. 6 total tablets taken on Fridays 03/10/19  Yes [provider]  metoprolol tartrate (LOPRESSOR) 25 MG  tablet Take 1 tablet (25 mg total) by mouth 2 (two) times daily. 12/19/18  Yes Erick BlinksMemon, Jehanzeb, MD  doxycycline (VIBRAMYCIN) 100 MG capsule Take 1 capsule (100 mg total) by mouth 2 (two) times daily. Patient not taking: Reported on 03/22/2019 12/19/18   Erick BlinksMemon, Jehanzeb, MD    Family History Family History  Problem Relation Age of Onset  . Hypertension Mother   . CAD Mother   . COPD Father   . Hypertension Father   . Rheum arthritis Paternal Uncle   . Lupus Paternal Uncle        Trait    Social History Social History   Tobacco Use  . Smoking status: Never Smoker  . Smokeless tobacco: Never Used  Substance Use Topics  . Alcohol use: No  . Drug use:  No     Allergies   Patient has no known allergies.   Review of Systems Review of Systems  Constitutional: Positive for fever. Negative for activity change.       All ROS Neg except as noted in HPI  Eyes: Negative for photophobia and discharge.  Respiratory: Negative for cough, shortness of breath and wheezing.   Cardiovascular: Negative for chest pain and palpitations.  Gastrointestinal: Negative for abdominal pain and blood in stool.  Genitourinary: Negative for dysuria, frequency and hematuria.  Musculoskeletal: Positive for arthralgias. Negative for back pain and neck pain.  Skin: Positive for rash.  Neurological: Negative for dizziness, seizures and speech difficulty.  Psychiatric/Behavioral: Negative for confusion and hallucinations. The patient is nervous/anxious.      Physical Exam Updated Vital Signs BP (!) 179/93 (BP Location: Left Arm)   Pulse 100   Temp 99.1 F (37.3 C) (Oral)   Resp 18   Ht 5\' 3"  (1.6 m)   Wt 108.9 kg   SpO2 97%   BMI 42.51 kg/m   Physical Exam Vitals signs and nursing note reviewed.  Constitutional:      Appearance: She is well-developed. She is not toxic-appearing.  HENT:     Head: Normocephalic.     Right Ear: Tympanic membrane and external ear normal.     Left Ear: Tympanic membrane and external ear normal.  Eyes:     General: Lids are normal.     Pupils: Pupils are equal, round, and reactive to light.  Neck:     Musculoskeletal: Normal range of motion and neck supple.     Vascular: No carotid bruit.  Cardiovascular:     Rate and Rhythm: Normal rate and regular rhythm.     Pulses: Normal pulses.     Heart sounds: Normal heart sounds.  Pulmonary:     Effort: No respiratory distress.     Breath sounds: Normal breath sounds.  Abdominal:     General: Bowel sounds are normal.     Palpations: Abdomen is soft.     Tenderness: There is no abdominal tenderness. There is no guarding.  Musculoskeletal: Normal range of motion.      Comments: There is good range of motion of right and left shoulder.  There is pain with range of motion of the right and left elbow.  The elbows are warm, but not hot.  There is no red streaking appreciated.  There is no evidence of joint effusion.  There is good range of motion of the wrist, but with some soreness.  There are degenerative changes of the hands.  There is pain with flexion and extension of the right and left knee with some  crepitus present.  There is no effusion appreciated.  No hot joint appreciated.  The joints are warm to touch.  There is full range of motion of both ankles.  There is soreness with flexion and extension of the toes.  The dorsalis pedis pulses are 2+.  The radial pulses are 2+.  Lymphadenopathy:     Head:     Right side of head: No submandibular adenopathy.     Left side of head: No submandibular adenopathy.     Cervical: No cervical adenopathy.  Skin:    General: Skin is warm and dry.     Findings: Rash present.     Comments: Patient has a macular erythematous rash in splotches of the neck and legs.  Neurological:     Mental Status: She is alert and oriented to person, place, and time.     Cranial Nerves: No cranial nerve deficit.     Sensory: No sensory deficit.  Psychiatric:        Speech: Speech normal.      ED Treatments / Results  Labs (all labs ordered are listed, but only abnormal results are displayed) Labs Reviewed - No data to display  EKG None  Radiology No results found.  Procedures Procedures (including critical care time)  Medications Ordered in ED Medications - No data to display   Initial Impression / Assessment and Plan / ED Course  I have reviewed the triage vital signs and the nursing notes.  Pertinent labs & imaging results that were available during my care of the patient were reviewed by me and considered in my medical decision making (see chart for details).          Final Clinical Impressions(s) / ED  Diagnoses MDM  Vital signs reviewed.  Blood pressure is elevated at 179/93.  The pulse oximetry is 97% on room air.  Within normal limits by my interpretation.  The patient has increased warmth of the elbows and knees.  There is some crepitus present.  There is no sign of a hot joint or septic joint at this time.  No significant joint effusion.  The examination favors an exacerbation of the degenerative joint disease for this patient.  Patient has a rash, but it is the rash that she is been experiencing over the last 4 months.  Patient will be treated in the emergency department with steroids, anti-inflammatory pain medication, and narcotic pain medication.  Prescription for short course of steroid and pain medication given to the patient.  Have asked the patient to follow-up with her specialist on Monday, May 11.  Patient is in agreement with this plan.   Final diagnoses:  Multiple joint pain    ED Discharge Orders         Ordered    dexamethasone (DECADRON) 4 MG tablet  2 times daily with meals     03/22/19 2328    HYDROcodone-acetaminophen (NORCO/VICODIN) 5-325 MG tablet  Every 4 hours PRN     03/22/19 2328           Ivery Quale, PA-C 03/22/19 2332    Jacalyn Lefevre, MD 03/23/19 [phone]

## 2019-03-22 NOTE — Discharge Instructions (Addendum)
Please continue your current medications.  Please add Decadron 2 times daily with food.  Use Norco for more severe pain.  Norco may cause drowsiness, and or lightheadedness.  Please use this medication with caution.  Please notify your specialist on Monday, May 11 of your significant breakthrough pain and discomfort.

## 2019-03-22 NOTE — ED Triage Notes (Signed)
Patient complaining of bilateral knee and elbow joint pain x 4 months. Also complaining of generalized rash and fever x 4 months.

## 2019-03-23 NOTE — ED Notes (Signed)
Advised patient not to drive after discharge due to narcotic medication administration. Also advised patient not to drive while taking prescription pain medication. Patient verbalized understanding. Discharged via wheelchair with family member to drive her home. 

## 2020-05-06 ENCOUNTER — Other Ambulatory Visit: Payer: Self-pay | Admitting: Gerontology

## 2020-05-06 DIAGNOSIS — Z1231 Encounter for screening mammogram for malignant neoplasm of breast: Secondary | ICD-10-CM

## 2021-02-01 ENCOUNTER — Encounter (HOSPITAL_COMMUNITY): Payer: Self-pay

## 2021-02-01 ENCOUNTER — Emergency Department (HOSPITAL_COMMUNITY)
Admission: EM | Admit: 2021-02-01 | Discharge: 2021-02-01 | Disposition: A | Discharge status: Home / Self Care | Payer: 59 | Attending: Emergency Medicine | Admitting: Emergency Medicine

## 2021-02-01 ENCOUNTER — Other Ambulatory Visit: Payer: Self-pay

## 2021-02-01 DIAGNOSIS — Z5321 Procedure and treatment not carried out due to patient leaving prior to being seen by health care provider: Secondary | ICD-10-CM | POA: Insufficient documentation

## 2021-02-01 DIAGNOSIS — M545 Low back pain, unspecified: Secondary | ICD-10-CM | POA: Insufficient documentation

## 2021-02-01 HISTORY — DX: Juvenile rheumatoid arthritis with systemic onset, unspecified site: M08.20

## 2021-02-01 NOTE — ED Triage Notes (Signed)
Pt presents to ED with complaints of lower back pain radiating into left hip, started yesterday. Pt denies numbness/tingling.

## 2021-02-01 NOTE — ED Notes (Signed)
Pt called for a room, no answer ?

## 2021-08-05 ENCOUNTER — Other Ambulatory Visit: Payer: Self-pay | Admitting: Gerontology

## 2021-08-05 DIAGNOSIS — Z1231 Encounter for screening mammogram for malignant neoplasm of breast: Secondary | ICD-10-CM

## 2021-11-07 ENCOUNTER — Emergency Department (HOSPITAL_COMMUNITY)
Admission: EM | Admit: 2021-11-07 | Discharge: 2021-11-07 | Disposition: A | Discharge status: Home / Self Care | Payer: 59 | Attending: Emergency Medicine | Admitting: Emergency Medicine

## 2021-11-07 DIAGNOSIS — M546 Pain in thoracic spine: Secondary | ICD-10-CM | POA: Diagnosis present

## 2021-11-07 DIAGNOSIS — M545 Low back pain, unspecified: Secondary | ICD-10-CM | POA: Insufficient documentation

## 2021-11-07 LAB — COMPREHENSIVE METABOLIC PANEL
ALT: 16 U/L (ref 0–44)
AST: 20 U/L (ref 15–41)
Albumin: 3.4 g/dL — ABNORMAL LOW (ref 3.5–5.0)
Alkaline Phosphatase: 79 U/L (ref 38–126)
Anion gap: 7 (ref 5–15)
BUN: 11 mg/dL (ref 6–20)
CO2: 24 mmol/L (ref 22–32)
Calcium: 8.5 mg/dL — ABNORMAL LOW (ref 8.9–10.3)
Chloride: 106 mmol/L (ref 98–111)
Creatinine, Ser: 0.73 mg/dL (ref 0.44–1.00)
GFR, Estimated: >60 mL/min (ref >60)
Glucose, Bld: 94 mg/dL (ref 70–99)
Potassium: 3.9 mmol/L (ref 3.5–5.1)
Sodium: 137 mmol/L (ref 135–145)
Total Bilirubin: 0.4 mg/dL (ref 0.3–1.2)
Total Protein: 6.9 g/dL (ref 6.5–8.1)

## 2021-11-07 LAB — LIPASE, BLOOD: Lipase: 28 U/L (ref 11–51)

## 2021-11-07 LAB — CBC WITH DIFFERENTIAL/PLATELET
Abs Immature Granulocytes: 0.03 10*3/uL (ref 0.00–0.07)
Basophils Absolute: 0 10*3/uL (ref 0.0–0.1)
Basophils Relative: 0 %
Eosinophils Absolute: 0.1 10*3/uL (ref 0.0–0.5)
Eosinophils Relative: 1 %
HCT: 38 % (ref 36.0–46.0)
Hemoglobin: 12.9 g/dL (ref 12.0–15.0)
Immature Granulocytes: 0 %
Lymphocytes Relative: 23 %
Lymphs Abs: 2.1 10*3/uL (ref 0.7–4.0)
MCH: 30.9 pg (ref 26.0–34.0)
MCHC: 33.9 g/dL (ref 30.0–36.0)
MCV: 91.1 fL (ref 80.0–100.0)
Monocytes Absolute: 0.7 10*3/uL (ref 0.1–1.0)
Monocytes Relative: 7 %
Neutro Abs: 6.2 10*3/uL (ref 1.7–7.7)
Neutrophils Relative %: 69 %
Platelets: 329 10*3/uL (ref 150–400)
RBC: 4.17 MIL/uL (ref 3.87–5.11)
RDW: 13.2 % (ref 11.5–15.5)
WBC: 9.2 10*3/uL (ref 4.0–10.5)
nRBC: 0 % (ref 0.0–0.2)

## 2021-11-07 LAB — TROPONIN I (HIGH SENSITIVITY): Troponin I (High Sensitivity): 11 ng/L (ref <18)

## 2021-11-07 MED ORDER — KETOROLAC TROMETHAMINE 30 MG/ML IJ SOLN
30.0000 mg | Freq: Once | INTRAMUSCULAR | Status: AC
  Administered 2021-11-07: 17:00:00 30 mg via INTRAVENOUS
  Filled 2021-11-07: qty 1

## 2021-11-07 MED ORDER — LIDOCAINE 5 % EX PTCH
1.0000 | MEDICATED_PATCH | CUTANEOUS | Status: DC
  Administered 2021-11-07: 17:00:00 1 via TRANSDERMAL
  Filled 2021-11-07: qty 1

## 2021-11-07 MED ORDER — PREDNISONE 10 MG (21) PO TBPK: ORAL_TABLET | Freq: Every day | ORAL | 0 refills | Status: AC

## 2021-11-07 MED ORDER — KETOROLAC TROMETHAMINE 30 MG/ML IJ SOLN: 30.0000 mg | Freq: Once | INTRAMUSCULAR | Status: DC

## 2021-11-07 MED ORDER — LIDOCAINE 5 % EX PTCH: 1.0000 | MEDICATED_PATCH | CUTANEOUS | 0 refills | Status: AC

## 2021-11-07 NOTE — ED Provider Notes (Signed)
[facility] EMERGENCY DEPARTMENT Provider Note   CSN: [id number] Arrival date & time: 11/07/21  1329     History Chief Complaint  Patient presents with   Back Pain    Veronica Nguyen is a 50 y.o. female. [place] with gradually worsening mid back pain started 4 days ago.  She has been taking home muscle relaxers, NSAIDs, Tylenol, tramadol with no relief.  She states that the pain has been gradually getting worse.  It does not radiate anywhere.  She has had previous cervical spine surgery and states that the sensations feel similar to when she required that.  She does state that she had an episode of incontinence earlier today where she felt like she did not have any bladder control.  This only happened one time and it was while she was bending over and compressing on her bladder. She denies any numbness or weakness in her extremities.  She denies any saddle anesthesia.  She does not have a history of cancer, diabetes, IV drug use.  She is on chronic immunosuppression therapy for her RA.  Denies any fevers or chills.  She has no abdominal pain, chest pain, shortness of breath.  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     Family History  Problem Relation  Age of Onset   Hypertension Mother    CAD Mother    COPD Father    Hypertension Father    Rheum arthritis Paternal Uncle    Lupus Paternal Uncle        Trait    Social History   Tobacco Use   Smoking status: Never   Smokeless tobacco: Never  Vaping Use   Vaping Use: Never used  Substance Use Topics   Alcohol use: No   Drug use: No    Home Medications Prior to Admission medications   Medication Sig Start Date End Date Taking? Authorizing Provider  lidocaine (LIDODERM) 5 % Place 1 patch onto the skin daily for 14 days. Remove & Discard patch within 12 hours or as directed by MD 11/07/21 11/21/21 Yes Colonel Krauser, Adora Fridge, PA-C  predniSONE (STERAPRED UNI-PAK 21 TAB) 10 MG (21) TBPK tablet Take by mouth daily. Take 6 tabs by mouth daily  for 2 days, then 5 tabs for 2 days, then 4 tabs for 2 days, then 3 tabs for 2 days, 2 tabs for 2 days, then 1 tab by mouth daily for 2 days 11/07/21  Yes Joal Eakle, Adora Fridge, PA-C  amitriptyline (ELAVIL) 100 MG tablet Take 100 mg by mouth at bedtime as needed for sleep.  12/30/18   [provider]  Calcium Carb-Cholecalciferol  600-800 MG-UNIT CHEW Chew 1 tablet by mouth every morning.  09/07/14   [provider]  colchicine 0.6 MG tablet Take 0.6 mg by mouth 2 (two) times a day. 03/12/19   [provider]  dexamethasone (DECADRON) 4 MG tablet Take 1 tablet (4 mg total) by mouth 2 (two) times daily with a meal. 03/22/19   Ivery Quale, PA-C  doxycycline (VIBRAMYCIN) 100 MG capsule Take 1 capsule (100 mg total) by mouth 2 (two) times daily. Patient not taking: Reported on 03/22/2019 12/19/18   Erick Blinks, MD  folic acid (FOLVITE) 1 MG tablet Take 1 mg by mouth See admin instructions. Take on all days except Fridays. 03/10/19   [provider]  gabapentin (NEURONTIN) 400 MG capsule Take 400 mg by mouth 3 (three) times daily. 03/10/19   [provider]  HYDROcodone-acetaminophen (NORCO/VICODIN) 5-325 MG tablet Take 1  tablet by mouth every 4 (four) hours as needed. 03/22/19   Ivery Quale, PA-C  ibuprofen (ADVIL) 200 MG tablet Take 800 mg by mouth every 6 (six) hours as needed for mild pain or moderate pain.     [provider]  lisinopril (PRINIVIL,ZESTRIL) 20 MG tablet Take 20 mg by mouth daily. 06/08/14   [provider]  methotrexate 2.5 MG tablet Take 15 mg by mouth every Friday. 6 total tablets taken on Fridays 03/10/19   [provider]  metoprolol tartrate (LOPRESSOR) 25 MG tablet Take 1 tablet (25 mg total) by mouth 2 (two) times daily. 12/19/18   Erick Blinks, MD    Allergies    Patient has no known allergies.  Review of Systems   Review of Systems  Constitutional:  Negative for chills and fever.  HENT:  Negative for congestion, rhinorrhea and sore throat.   Eyes:  Negative for visual disturbance.  Respiratory:  Negative for cough, chest tightness and shortness of breath.   Cardiovascular:  Negative for chest pain, palpitations and leg swelling.  Gastrointestinal:  Positive for nausea and vomiting. Negative for abdominal pain, blood in stool, constipation and diarrhea.  Genitourinary:  Positive for enuresis. Negative for dysuria, flank pain and hematuria.  Musculoskeletal:  Positive for back pain. Negative for gait problem.  Skin:  Negative for rash and wound.  Neurological:  Negative for dizziness, syncope, weakness, light-headedness and headaches.  Psychiatric/Behavioral:  Negative for confusion.   All other systems reviewed and are negative.  Physical Exam Updated Vital Signs BP (!) 168/103 (BP Location: Right Arm)    Pulse 94    Temp 98.2 F (36.8 C) (Oral)    Resp 18    Ht 5\' 3"  (1.6 m)    Wt 124.7 kg    SpO2 100%    BMI 48.71 kg/m   Physical Exam Vitals and nursing note reviewed.  Constitutional:      General: She is not in acute distress.    Appearance: Normal appearance. She is not ill-appearing, toxic-appearing or diaphoretic.  HENT:     Head:  Normocephalic and atraumatic.     Nose: No nasal deformity.     Mouth/Throat:     Lips: Pink. No lesions.     Mouth: No injury, lacerations, oral lesions or angioedema.     Pharynx: Uvula midline. No pharyngeal swelling or uvula swelling.  Eyes:     General: Gaze aligned appropriately. No scleral icterus.       Right eye: No discharge.        Left eye: No discharge.     Conjunctiva/sclera:  Conjunctivae normal.     Right eye: Right conjunctiva is not injected. No exudate or hemorrhage.    Left eye: Left conjunctiva is not injected. No exudate or hemorrhage. Cardiovascular:     Rate and Rhythm: Normal rate and regular rhythm.     Pulses: Normal pulses.          Radial pulses are 2+ on the right side and 2+ on the left side.       Dorsalis pedis pulses are 2+ on the right side and 2+ on the left side.     Heart sounds: Normal heart sounds, S1 normal and S2 normal. Heart sounds not distant. No murmur heard.   No friction rub. No gallop. No S3 or S4 sounds.  Pulmonary:     Effort: Pulmonary effort is normal. No accessory muscle usage or respiratory distress.     Breath sounds: Normal breath sounds. No stridor. No wheezing, rhonchi or rales.  Chest:     Chest wall: No tenderness.  Abdominal:     General: Abdomen is flat. Bowel sounds are normal. There is no distension.     Palpations: Abdomen is soft. There is no mass or pulsatile mass.     Tenderness: There is no abdominal tenderness. There is no guarding or rebound.  Musculoskeletal:     Right lower leg: No edema.     Left lower leg: No edema.     Comments: She has midline tenderness around T10-L2 area no stepoff or deformity; there is no reproducible muscular tenderness in paraspinal muscles DP/PT pulses 2+ and equal bilaterally No leg edema Sensation grossly intact on anterior thighs, dorsum of foot and lateral foot.  Sensation is grossly intact on the perineal areas. Strength of knee flexion and extension is 5/5 Plantar and  dorsiflexion of ankle 5/5 Gait normal   Skin:    General: Skin is warm and dry.     Coloration: Skin is not jaundiced or pale.     Findings: No bruising, erythema, lesion or rash.  Neurological:     General: No focal deficit present.     Mental Status: She is alert and oriented to person, place, and time.     GCS: GCS eye subscore is 4. GCS verbal subscore is 5. GCS motor subscore is 6.  Psychiatric:        Mood and Affect: Mood normal.        Behavior: Behavior normal. Behavior is cooperative.    ED Results / Procedures / Treatments   Labs (all labs ordered are listed, but only abnormal results are displayed) Labs Reviewed  COMPREHENSIVE METABOLIC PANEL - Abnormal; Notable for the following components:      Result Value   Calcium 8.5 (*)    Albumin 3.4 (*)    All other components within normal limits  LIPASE, BLOOD  CBC WITH DIFFERENTIAL/PLATELET  URINALYSIS, ROUTINE W REFLEX MICROSCOPIC  PREGNANCY, URINE  TROPONIN I (HIGH SENSITIVITY)  TROPONIN I (HIGH SENSITIVITY)    EKG None  Radiology No results found.  Procedures Procedures   Medications Ordered in ED Medications  lidocaine (LIDODERM) 5 % 1 patch (1 patch Transdermal Patch Applied 11/07/21 1718)  ketorolac (TORADOL) 30 MG/ML injection 30 mg (30 mg Intravenous Given 11/07/21 1719)    ED Course  I have reviewed the triage vital signs and the nursing notes.  Pertinent labs & imaging results that were available during my care of the patient were reviewed by me and considered in my medical decision  making (see chart for details).    MDM Rules/Calculators/A&P                          This is a 50 year old female who presents emergency department with midthoracic and lower back pain that has been gradually worsening for 4 days.  She is afebrile and vitals are stable.  She did have an episode of bladder incontinence today x 1 which is not typical of her, but no other red flag symptoms. Patient with normal gait.  Normal strength and sensation in lower ext. No perineal sensation deficit. She has no abdominal tenderness and exam with no other neurological abnormalities.   Labs are with no leukocytosis. No concerning labs to suggest an intraabdominal, cardiac, or GI etiology to her symptoms. This is likely musculoskeletal.  I spoke with patient about the possibility of getting an MRI today. We do not have MRI here at [facility] right now and getting this done would involve being transferred to [facility].  I don't think it is unreasonable for patient to be discharged and return if she develops another episode of incontinence or other red flag symptom. I am not entirely convinced that the episode of incontinence was related to a spinal injury because she has since been able to control her bladder and only a small amount came out during the episode.   Patient would prefer to be discharged home and follow up with her PCP. She will return if she develops any further symptoms. I will send her home with a steroid taper and lidoderm patches for her pain.   I discussed this case with my attending physician, Dr. Alvino Chapel who has agreed with the plan outlined above.       Final Clinical Impression(s) / ED Diagnoses Final diagnoses:  Acute midline thoracic back pain    Rx / DC Orders ED Discharge Orders          Ordered    predniSONE (STERAPRED UNI-PAK 21 TAB) 10 MG (21) TBPK tablet  Daily        11/07/21 1717    lidocaine (LIDODERM) 5 %  Every 24 hours        11/07/21 1720             Sheila Oats 11/07/21 1733    Davonna Belling, MD 11/08/21 [phone]

## 2021-11-07 NOTE — Discharge Instructions (Addendum)
Please schedule an appointment with your PCP to have an outpatient MRI ordered. I have prescribed you a steroid taper that you should take as prescribed.  I have also prescribed you lidocaine patches that you can place over the affected area.  Please return to the emergency room if you develop further episodes of inability to control your bowel or bladder, numbness in your arms or legs, progressive weakness of your lower extremities, or walking instability so that you can have an MRI done emergently.

## 2021-11-07 NOTE — ED Triage Notes (Signed)
Pt to ED c/o back pain, constant for 4 days. No relief with OTC pain meds. No identifiable injury.

## 2022-06-05 ENCOUNTER — Other Ambulatory Visit: Payer: Self-pay | Admitting: Family Medicine

## 2022-06-05 DIAGNOSIS — Z1231 Encounter for screening mammogram for malignant neoplasm of breast: Secondary | ICD-10-CM

## 2023-03-05 ENCOUNTER — Encounter: Payer: Self-pay | Admitting: *Deleted

## 2023-03-09 ENCOUNTER — Encounter: Payer: Self-pay | Admitting: *Deleted

## 2023-03-12 ENCOUNTER — Ambulatory Visit: Payer: 59 | Admitting: Certified Registered Nurse Anesthetist

## 2023-03-12 ENCOUNTER — Encounter
Admission: RE | Disposition: A | Discharge status: Home / Self Care | Payer: Self-pay | Source: Home / Self Care | Attending: Gastroenterology

## 2023-03-12 ENCOUNTER — Ambulatory Visit
Admission: RE | Admit: 2023-03-12 | Discharge: 2023-03-12 | Disposition: A | Discharge status: Home / Self Care | Payer: 59 | Attending: Gastroenterology | Admitting: Gastroenterology

## 2023-03-12 DIAGNOSIS — K640 First degree hemorrhoids: Secondary | ICD-10-CM | POA: Insufficient documentation

## 2023-03-12 DIAGNOSIS — Z6841 Body Mass Index (BMI) 40.0 and over, adult: Secondary | ICD-10-CM | POA: Diagnosis not present

## 2023-03-12 DIAGNOSIS — F419 Anxiety disorder, unspecified: Secondary | ICD-10-CM | POA: Diagnosis not present

## 2023-03-12 DIAGNOSIS — I10 Essential (primary) hypertension: Secondary | ICD-10-CM | POA: Diagnosis not present

## 2023-03-12 DIAGNOSIS — Z1211 Encounter for screening for malignant neoplasm of colon: Secondary | ICD-10-CM | POA: Diagnosis not present

## 2023-03-12 DIAGNOSIS — M069 Rheumatoid arthritis, unspecified: Secondary | ICD-10-CM | POA: Diagnosis not present

## 2023-03-12 DIAGNOSIS — M797 Fibromyalgia: Secondary | ICD-10-CM | POA: Insufficient documentation

## 2023-03-12 DIAGNOSIS — I471 Supraventricular tachycardia, unspecified: Secondary | ICD-10-CM | POA: Insufficient documentation

## 2023-03-12 DIAGNOSIS — Z83719 Family history of colon polyps, unspecified: Secondary | ICD-10-CM | POA: Diagnosis not present

## 2023-03-12 DIAGNOSIS — K5730 Diverticulosis of large intestine without perforation or abscess without bleeding: Secondary | ICD-10-CM | POA: Diagnosis not present

## 2023-03-12 HISTORY — DX: Vitamin D deficiency, unspecified: E55.9

## 2023-03-12 HISTORY — DX: Body mass index (BMI) 50.0-59.9, adult: E66.01

## 2023-03-12 HISTORY — DX: Deficiency of other specified B group vitamins: E53.8

## 2023-03-12 HISTORY — DX: Supraventricular tachycardia, unspecified: I47.10

## 2023-03-12 HISTORY — DX: Pain in thoracic spine: M54.6

## 2023-03-12 HISTORY — DX: Essential (primary) hypertension: I10

## 2023-03-12 HISTORY — DX: Other intervertebral disc degeneration, thoracic region: M51.34

## 2023-03-12 HISTORY — DX: Primary insomnia: F51.01

## 2023-03-12 HISTORY — DX: Rheumatoid arthritis, unspecified: M06.9

## 2023-03-12 HISTORY — DX: Anxiety disorder, unspecified: F41.9

## 2023-03-12 HISTORY — DX: Unspecified osteoarthritis, unspecified site: M19.90

## 2023-03-12 HISTORY — PX: COLONOSCOPY WITH PROPOFOL: SHX5780

## 2023-03-12 SURGERY — COLONOSCOPY WITH PROPOFOL
Anesthesia: General

## 2023-03-12 MED ORDER — PROPOFOL 10 MG/ML IV BOLUS
INTRAVENOUS | Status: DC | PRN
  Administered 2023-03-12: 70 mg via INTRAVENOUS

## 2023-03-12 MED ORDER — PROPOFOL 500 MG/50ML IV EMUL
INTRAVENOUS | Status: DC | PRN
  Administered 2023-03-12: 150 ug/kg/min via INTRAVENOUS

## 2023-03-12 MED ORDER — SODIUM CHLORIDE 0.9 % IV SOLN: INTRAVENOUS | Status: DC

## 2023-03-12 MED ORDER — PROPOFOL 1000 MG/100ML IV EMUL
INTRAVENOUS | Status: AC
  Filled 2023-03-12: qty 300

## 2023-03-12 NOTE — Anesthesia Postprocedure Evaluation (Signed)
Anesthesia Post Note  Patient: Veronica Nguyen  Procedure(s) Performed: COLONOSCOPY WITH PROPOFOL  Patient location during evaluation: PACU Anesthesia Type: General Level of consciousness: awake and alert, oriented and patient cooperative Pain management: pain level controlled Vital Signs Assessment: post-procedure vital signs reviewed and stable Respiratory status: spontaneous breathing, nonlabored ventilation and respiratory function stable Cardiovascular status: blood pressure returned to baseline and stable Postop Assessment: adequate PO intake Anesthetic complications: no   No notable events documented.   Last Vitals:  Vitals:   03/12/23 0809 03/12/23 0824  BP: (!) 157/85 130/88  Pulse:  86  Resp:  20  Temp:    SpO2:  99%    Last Pain:  Vitals:   03/12/23 0824  TempSrc:   PainSc: 0-No pain                 Reed Breech

## 2023-03-12 NOTE — Anesthesia Preprocedure Evaluation (Addendum)
Anesthesia Evaluation  Patient identified by MRN, date of birth, ID band Patient awake    Reviewed: Allergy & Precautions, NPO status , Patient's Chart, lab work & pertinent test results  History of Anesthesia Complications Negative for: history of anesthetic complications  Airway Mallampati: III   Neck ROM: Full    Dental  (+) Missing   Pulmonary neg pulmonary ROS   Pulmonary exam normal breath sounds clear to auscultation       Cardiovascular hypertension, Normal cardiovascular exam+ dysrhythmias Supra Ventricular Tachycardia  Rhythm:Regular Rate:Normal     Neuro/Psych  PSYCHIATRIC DISORDERS Anxiety     negative neurological ROS     GI/Hepatic negative GI ROS,,,  Endo/Other  Class 3 obesity  Renal/GU negative Renal ROS     Musculoskeletal  (+) Arthritis , Rheumatoid disorders,  Fibromyalgia -  Abdominal   Peds  Hematology negative hematology ROS (+)   Anesthesia Other Findings   Reproductive/Obstetrics                             Anesthesia Physical Anesthesia Plan  ASA: 3  Anesthesia Plan: General   Post-op Pain Management:    Induction: Intravenous  PONV Risk Score and Plan: 3 and Propofol infusion, TIVA and Treatment may vary due to age or medical condition  Airway Management Planned: Natural Airway  Additional Equipment:   Intra-op Plan:   Post-operative Plan:   Informed Consent: I have reviewed the patients History and Physical, chart, labs and discussed the procedure including the risks, benefits and alternatives for the proposed anesthesia with the patient or authorized representative who has indicated his/her understanding and acceptance.       Plan Discussed with: CRNA  Anesthesia Plan Comments: (LMA/GETA backup discussed.  Patient consented for risks of anesthesia including but not limited to:  - adverse reactions to medications - damage to eyes, teeth,  lips or other oral mucosa - nerve damage due to positioning  - sore throat or hoarseness - damage to heart, brain, nerves, lungs, other parts of body or loss of life  Informed patient about role of CRNA in peri- and intra-operative care.  Patient voiced understanding.)        Anesthesia Quick Evaluation

## 2023-03-12 NOTE — Interval H&P Note (Signed)
History and Physical Interval Note:  03/12/2023 7:39 AM  Veronica Nguyen  has presented today for surgery, with the diagnosis of family history colon polyps mother.  The various methods of treatment have been discussed with the patient and family. After consideration of risks, benefits and other options for treatment, the patient has consented to  Procedure(s): COLONOSCOPY WITH PROPOFOL (N/A) as a surgical intervention.  The patient's history has been reviewed, patient examined, no change in status, stable for surgery.  I have reviewed the patient's chart and labs.  Questions were answered to the patient's satisfaction.     Regis Bill  Ok to proceed with colonoscopy

## 2023-03-12 NOTE — H&P (Signed)
Outpatient short stay form Pre-procedure 03/12/2023  Regis Bill, MD  Primary Physician: Alm Bustard, NP  Reason for visit:  Screening colonoscopy  History of present illness:    52 y/o lady with history of obesity, RA, and hypertension here for screening colonoscopy due to family history of polyps in Mother which were apparently large. No blood thinners. No family history of GI malignancies. No significant abdominal surgeries.    Current Facility-Administered Medications:    0.9 %  sodium chloride infusion, , Intravenous, Continuous, Marwin Primmer, Rossie Muskrat, MD, Last Rate: 20 mL/hr at 03/12/23 0731, New Bag at 03/12/23 0731  Medications Prior to Admission  Medication Sig Dispense Refill Last Dose   cyanocobalamin (VITAMIN B12) 1000 MCG tablet Take 1,000 mcg by mouth daily.      ergocalciferol (VITAMIN D2) 1.25 MG (50000 UT) capsule Take 50,000 Units by mouth once a week.      Etanercept 50 MG/ML SOCT Inject into the skin.      lisinopril (PRINIVIL,ZESTRIL) 20 MG tablet Take 20 mg by mouth daily.   03/11/2023   metoprolol tartrate (LOPRESSOR) 25 MG tablet Take 1 tablet (25 mg total) by mouth 2 (two) times daily. 60 tablet 0 03/12/2023   nystatin (MYCOSTATIN/NYSTOP) powder Apply 1 Application topically 3 (three) times daily.      pregabalin (LYRICA) 50 MG capsule Take 50 mg by mouth 3 (three) times daily.   03/11/2023   amitriptyline (ELAVIL) 100 MG tablet Take 100 mg by mouth at bedtime as needed for sleep.       Calcium Carb-Cholecalciferol 600-800 MG-UNIT CHEW Chew 1 tablet by mouth every morning.       colchicine 0.6 MG tablet Take 0.6 mg by mouth 2 (two) times a day.      dexamethasone (DECADRON) 4 MG tablet Take 1 tablet (4 mg total) by mouth 2 (two) times daily with a meal. 10 tablet 0    doxycycline (VIBRAMYCIN) 100 MG capsule Take 1 capsule (100 mg total) by mouth 2 (two) times daily. 14 capsule 0    folic acid (FOLVITE) 1 MG tablet Take 1 mg by mouth See admin  instructions. Take on all days except Fridays.      gabapentin (NEURONTIN) 400 MG capsule Take 400 mg by mouth 3 (three) times daily.      HYDROcodone-acetaminophen (NORCO/VICODIN) 5-325 MG tablet Take 1 tablet by mouth every 4 (four) hours as needed. 12 tablet 0    ibuprofen (ADVIL) 200 MG tablet Take 800 mg by mouth every 6 (six) hours as needed for mild pain or moderate pain.       methotrexate 2.5 MG tablet Take 15 mg by mouth every Friday. 6 total tablets taken on Fridays      predniSONE (STERAPRED UNI-PAK 21 TAB) 10 MG (21) TBPK tablet Take by mouth daily. Take 6 tabs by mouth daily  for 2 days, then 5 tabs for 2 days, then 4 tabs for 2 days, then 3 tabs for 2 days, 2 tabs for 2 days, then 1 tab by mouth daily for 2 days 42 tablet 0      No Known Allergies   Past Medical History:  Diagnosis Date   Anxiety    Arthritis    Discogenic thoracic pain    Fibromyalgia    Hypertension    Lupus (HCC)    Morbid obesity with BMI of 50.0-59.9, adult (HCC)    Paroxysmal SVT (supraventricular tachycardia)    Primary insomnia    RA (rheumatoid  arthritis) (HCC)    Rheumatoid aortitis    Still's disease (HCC)    Vitamin B12 deficiency    Vitamin D deficiency     Review of systems:  Otherwise negative.    Physical Exam  Gen: Alert, oriented. Appears stated age.  HEENT: PERRLA. Lungs: No respiratory distress CV: RRR Abd: soft, benign, no masses Ext: No edema    Planned procedures: Proceed with colonoscopy. The patient understands the nature of the planned procedure, indications, risks, alternatives and potential complications including but not limited to bleeding, infection, perforation, damage to internal organs and possible oversedation/side effects from anesthesia. The patient agrees and gives consent to proceed.  Please refer to procedure notes for findings, recommendations and patient disposition/instructions.     Regis Bill, MD [facility]

## 2023-03-12 NOTE — Op Note (Addendum)
[facility] Patient Name: Veronica Nguyen Procedure Date: 03/12/2023 7:40 AM MRN: [id number] Account #: [id number] Date of Birth: [birth year] Admit Type: Outpatient Age: 52 Room: [facility] ENDO ROOM 3 Gender: Female Note Status: Finalized Instrument Name: Nelda Marseille [id number] Procedure:             Colonoscopy Indications:           Colon cancer screening in patient at increased risk:                         Family history of 1st-degree relative with colon polyps Providers:             Eather Colas MD, MD Referring MD:          Lisabeth Pick. Fields (Referring MD) Medicines:             Monitored Anesthesia Care Complications:         No immediate complications. Procedure:             Pre-Anesthesia Assessment:                        - Prior to the procedure, a History and Physical was                         performed, and patient medications and allergies were                         reviewed. The patient is competent. The risks and                         benefits of the procedure and the sedation options and                         risks were discussed with the patient. All questions                         were answered and informed consent was obtained.                         Patient identification and proposed procedure were                         verified by the physician, the nurse, the                         anesthesiologist, the anesthetist and the technician                         in the endoscopy suite. Mental Status Examination:                         alert and oriented. Airway Examination: normal                         oropharyngeal airway and neck mobility. Respiratory                         Examination: clear to auscultation. CV Examination:  normal. Prophylactic Antibiotics: The patient does not                         require prophylactic antibiotics. Prior                         Anticoagulants: The patient  has taken no anticoagulant                         or antiplatelet agents. ASA Grade Assessment: III - A                         patient with severe systemic disease. After reviewing                         the risks and benefits, the patient was deemed in                         satisfactory condition to undergo the procedure. The                         anesthesia plan was to use monitored anesthesia care                         (MAC). Immediately prior to administration of                         medications, the patient was re-assessed for adequacy                         to receive sedatives. The heart rate, respiratory                         rate, oxygen saturations, blood pressure, adequacy of                         pulmonary ventilation, and response to care were                         monitored throughout the procedure. The physical                         status of the patient was re-assessed after the                         procedure.                        After obtaining informed consent, the colonoscope was                         passed under direct vision. Throughout the procedure,                         the patient's blood pressure, pulse, and oxygen                         saturations were monitored continuously. The  Colonoscope was introduced through the anus and                         advanced to the the terminal ileum. The colonoscopy                         was performed without difficulty. The patient                         tolerated the procedure well. The quality of the bowel                         preparation was good. The terminal ileum, ileocecal                         valve, appendiceal orifice, and rectum were                         photographed. Findings:      The perianal and digital rectal examinations were normal.      The terminal ileum appeared normal.      Scattered small-mouthed diverticula were found in the sigmoid  colon,       descending colon and transverse colon.      Internal hemorrhoids were found during retroflexion. The hemorrhoids       were Grade I (internal hemorrhoids that do not prolapse).      The exam was otherwise without abnormality on direct and retroflexion       views. Impression:            - The examined portion of the ileum was normal.                        - Diverticulosis in the sigmoid colon, in the                         descending colon and in the transverse colon.                        - Internal hemorrhoids.                        - The examination was otherwise normal on direct and                         retroflexion views.                        - No specimens collected. Recommendation:        - Discharge patient to home.                        - Resume previous diet.                        - Continue present medications.                        - Repeat colonoscopy in 5 years for screening purposes.                        -  Return to referring physician as previously                         scheduled. Procedure Code(s):     --- Professional ---                        Z6109, Colorectal cancer screening; colonoscopy on                         individual at high risk Diagnosis Code(s):     --- Professional ---                        Z83.71, Family history of colonic polyps                        K64.0, First degree hemorrhoids                        K57.30, Diverticulosis of large intestine without                         perforation or abscess without bleeding CPT copyright 2022 [employer]. All rights reserved. The codes documented in this report are preliminary and upon coder review may  be revised to meet current compliance requirements. Eather Colas MD, MD 03/12/2023 8:01:33 AM Number of Addenda: 0 Note Initiated On: 03/12/2023 7:40 AM Scope Withdrawal Time: 0 hours 6 minutes 0 seconds  Total Procedure Duration: 0 hours 9 minutes 24  seconds  Estimated Blood Loss:  Estimated blood loss: none.      [facility]

## 2023-03-12 NOTE — Transfer of Care (Signed)
Immediate Anesthesia Transfer of Care Note  Patient: Veronica Nguyen  Procedure(s) Performed: COLONOSCOPY WITH PROPOFOL  Patient Location: PACU  Anesthesia Type:General  Level of Consciousness: awake and alert   Airway & Oxygen Therapy: Patient Spontanous Breathing  Post-op Assessment: Report given to RN and Post -op Vital signs reviewed and stable  Post vital signs: Reviewed and stable  Last Vitals:  Vitals Value Taken Time  BP 124/64 03/12/23 0759  Temp    Pulse 92 03/12/23 0759  Resp 17 03/12/23 0759  SpO2 98 % 03/12/23 0759  Vitals shown include unvalidated device data.  Last Pain:  Vitals:   03/12/23 0711  TempSrc: Temporal  PainSc: 6          Complications: No notable events documented.

## 2023-03-13 ENCOUNTER — Encounter: Payer: Self-pay | Admitting: Gastroenterology

## 2024-04-09 ENCOUNTER — Other Ambulatory Visit: Payer: Self-pay | Admitting: Family Medicine

## 2024-04-09 ENCOUNTER — Ambulatory Visit
Admission: RE | Admit: 2024-04-09 | Discharge: 2024-04-09 | Disposition: A | Discharge status: Home / Self Care | Source: Ambulatory Visit | Attending: Family Medicine | Admitting: Family Medicine

## 2024-04-09 DIAGNOSIS — M7989 Other specified soft tissue disorders: Secondary | ICD-10-CM | POA: Diagnosis present
